# Patient Record
Sex: Female | Born: 1985 | Marital: Single | Smoking: Never smoker
Health system: Southern US, Community
[De-identification: ages and names within clinical notes are randomized; demographics above are authoritative.]

## PROBLEM LIST (undated history)

## (undated) DIAGNOSIS — Z87440 Personal history of urinary (tract) infections: Secondary | ICD-10-CM

## (undated) DIAGNOSIS — Z975 Presence of (intrauterine) contraceptive device: Secondary | ICD-10-CM

## (undated) DIAGNOSIS — Z9889 Other specified postprocedural states: Secondary | ICD-10-CM

## (undated) DIAGNOSIS — T7840XA Allergy, unspecified, initial encounter: Secondary | ICD-10-CM

## (undated) DIAGNOSIS — Z9289 Personal history of other medical treatment: Secondary | ICD-10-CM

## (undated) DIAGNOSIS — L309 Dermatitis, unspecified: Secondary | ICD-10-CM

## (undated) HISTORY — DX: Allergy, unspecified, initial encounter: T78.40XA

## (undated) HISTORY — DX: Personal history of urinary (tract) infections: Z87.440

## (undated) HISTORY — PX: THERAPEUTIC ABORTION: SHX798

## (undated) HISTORY — PX: WISDOM TOOTH EXTRACTION: SHX21

## (undated) HISTORY — DX: Presence of (intrauterine) contraceptive device: Z97.5

## (undated) HISTORY — DX: Personal history of other medical treatment: Z92.89

## (undated) HISTORY — DX: Dermatitis, unspecified: L30.9

## (undated) HISTORY — DX: Other specified postprocedural states: Z98.890

---

## 2006-08-28 DIAGNOSIS — Z9289 Personal history of other medical treatment: Secondary | ICD-10-CM

## 2006-08-28 HISTORY — DX: Personal history of other medical treatment: Z92.89

## 2010-12-30 ENCOUNTER — Ambulatory Visit: Payer: Self-pay | Admitting: Medical

## 2011-01-02 ENCOUNTER — Ambulatory Visit (INDEPENDENT_AMBULATORY_CARE_PROVIDER_SITE_OTHER): Payer: Managed Care, Other (non HMO) | Admitting: Medical

## 2011-01-02 DIAGNOSIS — M25539 Pain in unspecified wrist: Secondary | ICD-10-CM

## 2011-01-12 ENCOUNTER — Encounter: Payer: Self-pay | Admitting: Medical

## 2011-01-17 ENCOUNTER — Encounter: Payer: Self-pay | Admitting: Medical

## 2011-01-17 ENCOUNTER — Ambulatory Visit (INDEPENDENT_AMBULATORY_CARE_PROVIDER_SITE_OTHER): Payer: Managed Care, Other (non HMO) | Admitting: Medical

## 2011-01-17 VITALS — BP 110/56 | HR 80 | Temp 98.1°F | Ht 59.0 in | Wt 119.0 lb

## 2011-01-17 DIAGNOSIS — M25531 Pain in right wrist: Secondary | ICD-10-CM

## 2011-01-17 DIAGNOSIS — M65839 Other synovitis and tenosynovitis, unspecified forearm: Secondary | ICD-10-CM

## 2011-01-17 DIAGNOSIS — R209 Unspecified disturbances of skin sensation: Secondary | ICD-10-CM

## 2011-01-17 DIAGNOSIS — M25539 Pain in unspecified wrist: Secondary | ICD-10-CM

## 2011-01-17 DIAGNOSIS — R202 Paresthesia of skin: Secondary | ICD-10-CM

## 2011-01-17 MED ORDER — TRAMADOL HCL 50 MG PO TABS: 50.0000 mg | ORAL_TABLET | Freq: Four times a day (QID) | ORAL | Status: DC | PRN

## 2011-01-17 MED ORDER — NAPROXEN 375 MG PO TABS: 375.0000 mg | ORAL_TABLET | Freq: Two times a day (BID) | ORAL | Status: DC

## 2011-01-17 NOTE — Progress Notes (Signed)
Subjective:   HPI  Haley Herrera is a 25 y.o. female who presents for recheck of right wrist pain and numbness. She saw me on 01/02/2011 for initial visit for same complaint. She notes that she used to work handling fiberoptic cable, and this was repetitive work using her hands. About a year ago there was a period where she worked an excessive amount of hours, and since then she's had this pain. Pain is worse with motions of the wrist, particularly twisting motion. She notes occasional numbness in the right fourth and fifth fingers. Since last visit she has used Mobic, ice, rest, and a carpal tunnel wrist splint, and she has been doing the same types or treatment measures for months without relief. No other aggravating or relieving factors. No other complaints. No prior evaluation by orthopedics or physical therapy.  The following portions of the patient's history were reviewed and updated as appropriate: allergies, current medications, past family history, past medical history, past social history, past surgical history and problem list.  Review of Systems Constitutional: denies fever, chills, sweats, unexpected weight change, anorexia, fatigue Dermatology: denies rash, redness, bruising Gastroenterology: denies abdominal pain, nausea, vomiting, diarrhea, constipation Musculoskeletal: denies myalgias, joint swelling, back pain Neurology: no headache, weakness     Objective:   Physical Exam  General appearance: alert, no distress, WD/WN,  Musculoskeletal: Right wrist with tenderness over the ulnar side, pain with wrist motion in eversion and inversion, mild pain with wrist flexion and extension, but no obvious swelling, erythema, deformity, otherwise bilateral upper extremities without deformity, normal range of motion, non tender Extremities: no edema, no cyanosis, no clubbing Pulses: 2+ symmetric, upper and lower extremities, normal cap refill Neurological: Normal sensation and strength of  bilateral hands and fingers, negative Phalen's and negative Tinel's  X-ray of right wrist today without bony deformity, no soft tissue swelling, negative.   Assessment :    Encounter Diagnoses  Name Primary?  . Wrist pain, right Yes  . Hand tendonitis   . Paresthesia of thumb of right hand       Plan:  Advise relative rest, ice, we discontinued Mobic, and we will give Naprosyn twice a day to try stated. She can use Ultram when necessary for pain. We will refer to hand and rehabilitation specialist for therapy for the ulnar point tenderness which is likely a combination of right wrist ulnar tendinitis and overuse injury. If no improvement with these measures, we'll need to consider nerve conduction studies for the paresthesias that seem to suggest ulnar nerve syndrome.

## 2011-02-05 ENCOUNTER — Other Ambulatory Visit: Payer: Self-pay | Admitting: Medical

## 2011-03-22 ENCOUNTER — Other Ambulatory Visit: Payer: Self-pay | Admitting: Medical

## 2011-03-22 ENCOUNTER — Telehealth: Payer: Self-pay | Admitting: *Deleted

## 2011-03-22 MED ORDER — MELOXICAM 7.5 MG PO TABS: 7.5000 mg | ORAL_TABLET | Freq: Every day | ORAL | Status: DC

## 2011-03-22 NOTE — Telephone Encounter (Signed)
Call out the Mobic to her pharmacy.  I updated it in the chart.  Make sure she isn't taking this plus Naprosyn or other antiinflammatories.  F/u with physical therapy as planned.

## 2011-03-22 NOTE — Telephone Encounter (Addendum)
Message copied by Dorthula Perfect on Wed Mar 22, 2011 11:01 AM ------      Message from: Jac Canavan      Created: Tue Mar 21, 2011  2:27 PM       I received refill request on Mobic.   I haven't seen physical therapy notes or heard back from her.  Pls call and see how her hand is doing, and ask about therapy and meds.  Pls call [facility] for their notes as well.    Called and spoke to pt regarding refill on mobic.  Pt states still having pain in wrist and would like the mobic refilled.  Called [facility] and referral didn't go thru to them so faxed it again to schedule an appointment for pt.  Pt agreed to go.  CM, LPN

## 2011-03-23 NOTE — Telephone Encounter (Signed)
Called in Mobic 7.5 mg 1 po BID #60 with 1 refill per Vincenza Hews.  Pt aware.  CM, LPN

## 2011-06-29 DIAGNOSIS — Z975 Presence of (intrauterine) contraceptive device: Secondary | ICD-10-CM

## 2011-06-29 HISTORY — DX: Presence of (intrauterine) contraceptive device: Z97.5

## 2011-07-04 ENCOUNTER — Telehealth: Payer: Self-pay | Admitting: Family Medicine

## 2011-07-06 NOTE — Telephone Encounter (Signed)
Discard

## 2011-07-12 ENCOUNTER — Encounter: Payer: Self-pay | Admitting: Obstetrics & Gynecology

## 2011-07-12 ENCOUNTER — Ambulatory Visit (INDEPENDENT_AMBULATORY_CARE_PROVIDER_SITE_OTHER): Payer: 59 | Admitting: Obstetrics & Gynecology

## 2011-07-12 VITALS — BP 119/83 | HR 74 | Ht 59.0 in | Wt 116.0 lb

## 2011-07-12 DIAGNOSIS — Z01812 Encounter for preprocedural laboratory examination: Secondary | ICD-10-CM

## 2011-07-12 DIAGNOSIS — Z975 Presence of (intrauterine) contraceptive device: Secondary | ICD-10-CM

## 2011-07-12 DIAGNOSIS — Z30430 Encounter for insertion of intrauterine contraceptive device: Secondary | ICD-10-CM

## 2011-07-12 NOTE — Progress Notes (Signed)
Patient would like Mirena IUD today it was reccomended by her boyfriends sister.  She is using condoms for protection right now but she has had unprotected intercourse in the last two weeks.  Her pregnancy test today is negative.  She did have a pap smear this year at [facility].

## 2011-07-12 NOTE — Progress Notes (Signed)
  Subjective:    Patient ID: Norberto Sorenson, female    DOB: 03-Nov-1985, 25 y.o.   MRN: [id number]  HPI  She is currently using the rhythm method for birth control and would like the Mirena.  We have discussed the possible side effect of irregular VB.  She is having her period today and her urine pregnancy test is negative today.  Review of Systems Her pap is UTD at [facility]   Objective:   Physical Exam   IUD Insertion Procedure Note  Pre-operative Diagnosis: desires LARC  Post-operative Diagnosis: same  Indications: contraception  Procedure Details  Urine pregnancy test was done today and result was negative  The risks (including infection, bleeding, pain, and uterine perforation) and benefits of the procedure were explained to the patient and Verbal informed consent was obtained.    Bimanual exam revealed a NSSA uterus and non-enlarged adnexae. Cervix cleansed with Betadine. Uterus sounded to 7 cm. IUD inserted without difficulty. String visible and trimmed. Patient tolerated procedure well.  IUD Information: Mirena.  Condition: Stable  Complications: None  Plan:  The patient was advised to call for any fever or for prolonged or severe pain or bleeding. She was advised to use NSAID as needed for mild to moderate pain.   Attending Physician Documentation: I was present for or participated in the entire procedure, including opening and closing.     Assessment & Plan:  LARC- Mirena placed. She will come back for a string check in 4 weeks. She will sign a release form so we can get a copy of her latest pap smear.

## 2011-07-12 NOTE — Progress Notes (Signed)
Addended by: Allie Bossier on: 07/12/2011 10:31 AM   Modules accepted: Orders

## 2011-07-13 LAB — GC/CHLAMYDIA PROBE AMP, URINE
Chlamydia, Swab/Urine, PCR: NEGATIVE
GC Probe Amp, Urine: NEGATIVE

## 2011-08-01 ENCOUNTER — Ambulatory Visit: Payer: Self-pay | Admitting: Family Medicine

## 2011-08-03 ENCOUNTER — Ambulatory Visit: Payer: Self-pay | Admitting: Family Medicine

## 2011-08-03 DIAGNOSIS — Z0289 Encounter for other administrative examinations: Secondary | ICD-10-CM

## 2011-08-09 ENCOUNTER — Encounter: Payer: Self-pay | Admitting: Medical

## 2011-08-09 ENCOUNTER — Ambulatory Visit (INDEPENDENT_AMBULATORY_CARE_PROVIDER_SITE_OTHER): Payer: 59 | Admitting: Medical

## 2011-08-09 DIAGNOSIS — J069 Acute upper respiratory infection, unspecified: Secondary | ICD-10-CM | POA: Insufficient documentation

## 2011-08-09 DIAGNOSIS — H68009 Unspecified Eustachian salpingitis, unspecified ear: Secondary | ICD-10-CM | POA: Insufficient documentation

## 2011-08-09 MED ORDER — MOMETASONE FUROATE 50 MCG/ACT NA SUSP: 2.0000 | Freq: Every day | NASAL | Status: DC

## 2011-08-09 MED ORDER — AMOXICILLIN 875 MG PO TABS: 875.0000 mg | ORAL_TABLET | Freq: Two times a day (BID) | ORAL | Status: AC

## 2011-08-09 NOTE — Patient Instructions (Signed)
Continue to drink plenty of water, continue Mucinex DM for cough and congestion, you can do the Nyquil at night for cough.   Consider nasal saline flush such as Neti Pot.  Begin Nasonex nasal spray, 1-2 sprays per nostril twice daily.  If no improvement in 24-48 hours, or if fever or much worse ear pain in the meantime, then begin Amoxicillin antibiotic.    Use Advil if desired for pain.    Barotitis Media Barotitis media is soreness (inflammation) of the area behind the eardrum (middle ear). This occurs when the auditory tube (Eustachian tube) leading from the back of the throat to the eardrum is blocked. When it is blocked air cannot move in and out of the middle ear to equalize pressure changes. These pressure changes come from changes in altitude when:  Flying.   Driving in the mountains.   Diving.  Problems are more likely to occur with pressure changes during times when you are congested as from:  Hay fever.   Upper respiratory infection.   A cold.  Damage or hearing loss (barotrauma) caused by this may be permanent. HOME CARE INSTRUCTIONS   Use medicines as recommended by your caregiver. Over the counter medicines will help unblock the canal and can help during times of air travel.   Do not put anything into your ears to clean or unplug them. Eardrops will not be helpful.   Do not swim, dive, or fly until your caregiver says it is all right to do so. If these activities are necessary, chewing gum with frequent swallowing may help. It is also helpful to hold your nose and gently blow to pop your ears for equalizing pressure changes. This forces air into the Eustachian tube.   For little ones with problems, give your baby a bottle of water or juice during periods when pressure changes would be anticipated such as during take offs and landings associated with air travel.   Only take over-the-counter or prescription medicines for pain, discomfort, or fever as directed by your  caregiver.   A decongestant may be helpful in de-congesting the middle ear and make pressure equalization easier. This can be even more effective if the drops (spray) are delivered with the head lying over the edge of a bed with the head tilted toward the ear on the affected side.   If your caregiver has given you a follow-up appointment, it is very important to keep that appointment. Not keeping the appointment could result in a chronic or permanent injury, pain, hearing loss and disability. If there is any problem keeping the appointment, you must call back to this facility for assistance.  SEEK IMMEDIATE MEDICAL CARE IF:   You develop a severe headache, dizziness, severe ear pain, or bloody or pus-like drainage from your ears.   An oral temperature above 102 F (38.9 C) develops.   Your problems do not improve or become worse.  MAKE SURE YOU:   Understand these instructions.   Will watch your condition.   Will get help right away if you are not doing well or get worse.  Document Released: 08/11/2000 Document Revised: 04/26/2011 Document Reviewed: 03/19/2008 [facility] Patient Information 2012 [place], [place].

## 2011-08-09 NOTE — Progress Notes (Signed)
Subjective:   HPI  Haley Herrera is a 25 y.o. female who presents with 1 wk hx/o cold symptoms.  She reports cough, productive of phlegm, headache, sore throat, sinus pressure, subjective fever, chills, some nausea.  Was worse last week with cold symptoms, but in the last few days has ear pressure, left worse than right, feels off balance, had popping in left ear.  She denies hx/o asthma, or tobacco use.  No other aggravating or relieving factors.    No other c/o.  The following portions of the patient's history were reviewed and updated as appropriate: allergies, current medications, past family history, past medical history, past social history, past surgical history and problem list.  Past Medical History  Diagnosis Date  . Allergy   . Arthritis   . Ulcer    Review of Systems Constitutional: -fever, +chills, +sweats, -unexpected -weight change,+fatigue ENT: -runny nose, +ear pain, +sore throat Cardiology:  +chest pain, -palpitations, -edema Respiratory: +cough, -shortness of breath, -wheezing,+ CONGESTION Gastroenterology: -abdominal pain, -nausea, -vomiting, -diarrhea, -constipation Hematology: -bleeding or bruising problems Musculoskeletal: -arthralgias, -myalgias, -joint swelling, -back pain Ophthalmology: -vision changes Urology: -dysuria, -difficulty urinating, -hematuria, -urinary frequency, -urgency Neurology: +headache, -weakness, -tingling, -numbness   Objective:   Filed Vitals:   08/09/11 1528  BP: 90/60  Pulse: 68  Temp: 98.5 F (36.9 C)  Resp: 16    General appearance: Alert, WD/WN, no distress                             Skin: warm, no rash                           Head: +mild sinus tenderness,                            Eyes: conjunctiva normal, corneas clear, PERRLA                            Ears: serous fluid and fullness behind TMs, but no erythema, external ear canals normal                          Nose: septum midline, turbinates swollen, with  erythema and clear discharge             Mouth/throat: MMM, tongue normal, mild pharyngeal erythema                           Neck: supple, no adenopathy, no thyromegaly, nontender                          Heart: RRR, normal S1, S2, no murmurs                         Lungs: CTA bilaterally, no wheezes, rales, or rhonchi      Assessment and Plan:   Encounter Diagnoses  Name Primary?  . Eustachian salpingitis Yes  . URI (upper respiratory infection)    Prescription given for Amoxicillin if not improved in a few days with supportive care, sample of Nasonex, and nasal saline.   Can c/t Mucinex DM for congestion.  Tylenol or Ibuprofen OTC for fever and malaise.  Discussed symptomatic relief, nasal saline,  and call or return if worse or not improving.

## 2011-08-11 ENCOUNTER — Encounter: Payer: Self-pay | Admitting: Medical

## 2011-08-11 ENCOUNTER — Ambulatory Visit (INDEPENDENT_AMBULATORY_CARE_PROVIDER_SITE_OTHER): Payer: 59 | Admitting: Medical

## 2011-08-11 VITALS — BP 100/70 | HR 72 | Temp 98.3°F | Resp 16 | Ht 60.0 in | Wt 114.0 lb

## 2011-08-11 DIAGNOSIS — Z0000 Encounter for general adult medical examination without abnormal findings: Secondary | ICD-10-CM

## 2011-08-11 LAB — POCT URINALYSIS DIPSTICK
Nitrite, UA: NEGATIVE
Spec Grav, UA: <=1.005
Urobilinogen, UA: NEGATIVE
pH, UA: 8

## 2011-08-11 LAB — COMPREHENSIVE METABOLIC PANEL
AST: 22 U/L (ref 0–37)
Albumin: 4.8 g/dL (ref 3.5–5.2)
Alkaline Phosphatase: 49 U/L (ref 39–117)
BUN: 12 mg/dL (ref 6–23)
Calcium: 9.8 mg/dL (ref 8.4–10.5)
Chloride: 102 mEq/L (ref 96–112)
Creat: 0.85 mg/dL (ref 0.50–1.10)
Glucose, Bld: 79 mg/dL (ref 70–99)

## 2011-08-11 LAB — CBC WITH DIFFERENTIAL/PLATELET
Basophils Absolute: 0 10*3/uL (ref 0.0–0.1)
Basophils Relative: 0 % (ref 0–1)
Hemoglobin: 13.8 g/dL (ref 12.0–15.0)
Lymphocytes Relative: 16 % (ref 12–46)
MCHC: 33 g/dL (ref 30.0–36.0)
Neutro Abs: 9.7 10*3/uL — ABNORMAL HIGH (ref 1.7–7.7)
Neutrophils Relative %: 77 % (ref 43–77)
RDW: 13.3 % (ref 11.5–15.5)
WBC: 12.6 10*3/uL — ABNORMAL HIGH (ref 4.0–10.5)

## 2011-08-11 LAB — LIPID PANEL
Cholesterol: 156 mg/dL (ref 0–200)
Triglycerides: 80 mg/dL (ref <150)

## 2011-08-11 MED ORDER — BENZONATATE 200 MG PO CAPS: 200.0000 mg | ORAL_CAPSULE | Freq: Three times a day (TID) | ORAL | Status: AC | PRN

## 2011-08-11 NOTE — Patient Instructions (Signed)

## 2011-08-12 ENCOUNTER — Encounter: Payer: Self-pay | Admitting: Medical

## 2011-08-12 DIAGNOSIS — Z0000 Encounter for general adult medical examination without abnormal findings: Secondary | ICD-10-CM | POA: Insufficient documentation

## 2011-08-12 NOTE — Progress Notes (Signed)
Subjective:   HPI  Haley Herrera is a 25 y.o. female who presents for a complete physical.  I saw her recently for sinus infection.  She recently had IUD inserted in 11/12, and is having frequent bleeding.  She plans to discuss with her gynecologist Dr. Marice Potter next week.  Her gynecological hx/o includes 3 pregnancies with 1 TAB and 2 SAB.  She declines flu shot.  Last Tetanus within 10 years per her report.  [place] originally [place] and moved to the [place] in 2006, had vaccines updated then.  I saw her a few months ago for wrist and hand pain and numbness, and after I sent her to physical therapy, she says the PT resolved the symptoms.  Otherwise in normal state of health.  Eating healthy, exercising regularly, works as a live in Social worker.  Reviewed their medical, surgical, family, social, medication, and allergy history and updated chart as appropriate.  Past Medical History  Diagnosis Date  . Allergy   . Arthritis   . Ulcer   . History of UTI   . Gastric ulcer 2008  . IUD (intrauterine device) in place 06/2011    Mirena, Dr. Marice Potter  . History of gynecological procedure     TAB x 1, SAB x 2    Past Surgical History  Procedure Date  . Wisdom tooth extraction     Family History  Problem Relation Age of Onset  . Hypertension Mother   . Kidney disease Father   . Diabetes Maternal Grandmother   . Heart disease Maternal Grandfather   . Cancer Neg Hx   . Stroke Neg Hx     History   Social History  . Marital Status: Single    Spouse Name: N/A    Number of Children: N/A  . Years of Education: N/A   Occupational History  . nanny    Social History Main Topics  . Smoking status: Never Smoker   . Smokeless tobacco: Never Used  . Alcohol Use: 1.5 oz/week    3 drink(s) per week  . Drug Use: No  . Sexually Active: Yes -- Female partner(s)    Birth Control/ Protection: Condom   Other Topics Concern  . Not on file   Social History Narrative   Single, exercise - run, walk,  weights, Christian    Current Outpatient Prescriptions on File Prior to Visit  Medication Sig Dispense Refill  . amoxicillin (AMOXIL) 875 MG tablet Take 1 tablet (875 mg total) by mouth 2 (two) times daily.  20 tablet  0  . mometasone (NASONEX) 50 MCG/ACT nasal spray Place 2 sprays into the nose daily.  17 g  0    No Known Allergies   Review of Systems Constitutional: denies fever, chills, sweats, unexpected weight change, anorexia, fatigue Allergy: negative; denies recent sneezing, itching, congestion Dermatology: denies changing moles, rash, lumps, new worrisome lesions ENT: +runny nose, sinus pressure, but no ear pain, sore throat, hoarseness, teeth pain, tinnitus, hearing loss, epistaxis Cardiology: denies chest pain, palpitations, edema, orthopnea, paroxysmal nocturnal dyspnea Respiratory: denies cough, shortness of breath, dyspnea on exertion, wheezing, hemoptysis Gastroenterology: denies abdominal pain, nausea, vomiting, diarrhea, constipation, blood in stool, changes in bowel movement, dysphagia Hematology: denies bleeding or bruising problems Musculoskeletal: denies arthralgias, myalgias, joint swelling, back pain, neck pain, cramping, gait changes Ophthalmology: denies vision changes, eye redness, itching, discharge Urology: denies dysuria, difficulty urinating, hematuria, urinary frequency, urgency, incontinence Neurology: no headache, weakness, tingling, numbness, speech abnormality, memory loss, falls, dizziness  Psychology: denies depressed mood, agitation, sleep problems     Objective:   Physical Exam  Filed Vitals:   08/11/11 1006  BP: 100/70  Pulse: 72  Temp: 98.3 F (36.8 C)  Resp: 16    General appearance: alert, no distress, WD/WN, [place] female Skin: unremarkable HEENT: normocephalic, conjunctiva/corneas normal, sclerae anicteric, PERRLA, EOMi, nares patent, no discharge or erythema, pharynx normal Oral cavity: MMM, tongue normal, teeth  normal Neck: supple, no lymphadenopathy, no thyromegaly, no masses, normal ROM Chest: non tender, normal shape and expansion Heart: RRR, normal S1, S2, no murmurs Lungs: CTA bilaterally, no wheezes, rhonchi, or rales Abdomen: +bs, soft, non tender, non distended, no masses, no hepatomegaly, no splenomegaly, no bruits Back: non tender, normal ROM, no scoliosis Musculoskeletal: upper extremities non tender, no obvious deformity, normal ROM throughout, lower extremities non tender, no obvious deformity, normal ROM throughout Extremities: no edema, no cyanosis, no clubbing Pulses: 2+ symmetric, upper and lower extremities, normal cap refill Neurological: alert, oriented x 3, CN2-12 intact, strength normal upper extremities and lower extremities, sensation normal throughout, DTRs 2+ throughout, no cerebellar signs, gait normal Psychiatric: normal affect, behavior normal, pleasant  Breast/gyn - deferred to gynecology   Assessment and Plan :    Encounter Diagnosis  Name Primary?  . Routine general medical examination at a health care facility Yes   Physical exam - discussed healthy lifestyle, diet, exercise, preventative care, vaccinations, and addressed their concerns.  Handout given.  Follow-up pending labs.

## 2011-09-07 ENCOUNTER — Encounter: Payer: Self-pay | Admitting: Obstetrics & Gynecology

## 2011-09-07 ENCOUNTER — Ambulatory Visit (INDEPENDENT_AMBULATORY_CARE_PROVIDER_SITE_OTHER): Payer: 59 | Admitting: Obstetrics & Gynecology

## 2011-09-07 VITALS — BP 103/74 | HR 68 | Ht 59.0 in | Wt 116.0 lb

## 2011-09-07 DIAGNOSIS — Z1272 Encounter for screening for malignant neoplasm of vagina: Secondary | ICD-10-CM

## 2011-09-07 DIAGNOSIS — Z113 Encounter for screening for infections with a predominantly sexual mode of transmission: Secondary | ICD-10-CM

## 2011-09-07 DIAGNOSIS — Z0000 Encounter for general adult medical examination without abnormal findings: Secondary | ICD-10-CM

## 2011-09-07 NOTE — Progress Notes (Signed)
Patient is here for routine exam.  Wants GC/Chly with pap but declines blood tests.

## 2011-09-07 NOTE — Progress Notes (Signed)
Addended by: Barbara Cower on: 09/07/2011 12:14 PM   Modules accepted: Orders

## 2011-09-07 NOTE — Progress Notes (Signed)
Subjective:    Haley Herrera is a 26 y.o. female who presents for an annual exam. The patient has no complaints today. She is happy with the Mirenaq. The patient is sexually active. GYN screening history: last pap: was normal. The patient wears seatbelts: yes. The patient participates in regular exercise: yes. Has the patient ever been transfused or tattooed?: no. The patient reports that there is not domestic violence in her life.   Menstrual History: OB History    Grav Para Term Preterm Abortions TAB SAB Ect Mult Living   2 0 0 0 2 1 1 0 0 0       Menarche age: 27 No LMP recorded. Patient is not currently having periods (Reason: IUD).    The following portions of the patient's history were reviewed and updated as appropriate: allergies, current medications, past family history, past medical history, past social history, past surgical history and problem list.  Review of Systems A comprehensive review of systems was negative. She is currently a Social worker.   Objective:    BP 103/74  Pulse 68  Ht 4\' 11"  (1.499 m)  Wt 116 lb (52.617 kg)  BMI 23.43 kg/m2  General Appearance:    Alert, cooperative, no distress, appears stated age  Head:    Normocephalic, without obvious abnormality, atraumatic  Eyes:    PERRL, conjunctiva/corneas clear, EOM's intact, fundi    benign, both eyes  Ears:    Normal TM's and external ear canals, both ears  Nose:   Nares normal, septum midline, mucosa normal, no drainage    or sinus tenderness  Throat:   Lips, mucosa, and tongue normal; teeth and gums normal  Neck:   Supple, symmetrical, trachea midline, no adenopathy;    thyroid:  no enlargement/tenderness/nodules; no carotid   bruit or JVD  Back:     Symmetric, no curvature, ROM normal, no CVA tenderness  Lungs:     Clear to auscultation bilaterally, respirations unlabored  Chest Wall:    No tenderness or deformity   Heart:    Regular rate and rhythm, S1 and S2 normal, no murmur, rub   or gallop  Breast  Exam:    No tenderness, masses, or nipple abnormality  Abdomen:     Soft, non-tender, bowel sounds active all four quadrants,    no masses, no organomegaly  Genitalia:    Normal female without lesion, discharge or tenderness, NSSA, NT, no adnexal masses.     Extremities:   Extremities normal, atraumatic, no cyanosis or edema  Pulses:   2+ and symmetric all extremities  Skin:   Skin color, texture, turgor normal, no rashes or lesions  Lymph nodes:   Cervical, supraclavicular, and axillary nodes normal  Neurologic:   CNII-XII intact, normal strength, sensation and reflexes    throughout  .    Assessment:    Healthy female exam.    Plan:     Pap smear. She declines Gardasil and the flu vaccine.

## 2011-10-24 ENCOUNTER — Ambulatory Visit: Payer: 59 | Admitting: Obstetrics & Gynecology

## 2012-02-12 ENCOUNTER — Ambulatory Visit: Payer: 59 | Admitting: Obstetrics & Gynecology

## 2012-02-21 ENCOUNTER — Encounter: Payer: Self-pay | Admitting: Obstetrics and Gynecology

## 2012-02-21 ENCOUNTER — Ambulatory Visit (INDEPENDENT_AMBULATORY_CARE_PROVIDER_SITE_OTHER): Payer: 59 | Admitting: Obstetrics and Gynecology

## 2012-02-21 VITALS — BP 133/84 | HR 90 | Ht 59.0 in | Wt 111.0 lb

## 2012-02-21 DIAGNOSIS — N939 Abnormal uterine and vaginal bleeding, unspecified: Secondary | ICD-10-CM

## 2012-02-21 NOTE — Progress Notes (Signed)
  26 yo G0 with LMP 6/15 presenting today for evaluation of abnormal uterine bleeding. Patient reports experiencing 2-3 weeks/ month of vaginal spotting since IUD insertion. Patient initially thought it would self resolve but it hasn't almost a year after insertion. Patient states that prior to IUD she had a normal monthly period lasting 5 days. Patient denies any cramping pain  SSE: Thin white frothy discharge. Normal vagina and cervix. IUD strings visualized at os Bimanual exam. Small uterus, no palpable adnexal masses or tenderness  A/P 26 yo with abnormal uterine bleeding s/p IUD placement almost a year ago - Wet prep collected - Will order ultrasound for IUD placement - Discussed trial of OCP with IUD. - Patient will be contacted with results.

## 2012-02-22 LAB — WET PREP, GENITAL: Trich, Wet Prep: NONE SEEN

## 2012-02-22 MED ORDER — METRONIDAZOLE 500 MG PO TABS: 500.0000 mg | ORAL_TABLET | Freq: Two times a day (BID) | ORAL | Status: AC

## 2012-02-22 NOTE — Progress Notes (Signed)
Patient with BV- prescription e-prescribed.

## 2012-02-22 NOTE — Addendum Note (Signed)
Addended by: Catalina Antigua on: 02/22/2012 08:38 AM   Modules accepted: Orders

## 2012-09-10 ENCOUNTER — Ambulatory Visit (INDEPENDENT_AMBULATORY_CARE_PROVIDER_SITE_OTHER): Payer: Commercial Indemnity | Admitting: Obstetrics & Gynecology

## 2012-09-10 ENCOUNTER — Encounter: Payer: Self-pay | Admitting: Obstetrics & Gynecology

## 2012-09-10 ENCOUNTER — Inpatient Hospital Stay (HOSPITAL_COMMUNITY): Admission: RE | Admit: 2012-09-10 | Payer: Self-pay | Source: Ambulatory Visit

## 2012-09-10 VITALS — BP 114/79 | HR 63 | Ht 59.0 in | Wt 124.0 lb

## 2012-09-10 DIAGNOSIS — N898 Other specified noninflammatory disorders of vagina: Secondary | ICD-10-CM

## 2012-09-10 DIAGNOSIS — Z1151 Encounter for screening for human papillomavirus (HPV): Secondary | ICD-10-CM

## 2012-09-10 DIAGNOSIS — Z01419 Encounter for gynecological examination (general) (routine) without abnormal findings: Secondary | ICD-10-CM

## 2012-09-10 DIAGNOSIS — Z124 Encounter for screening for malignant neoplasm of cervix: Secondary | ICD-10-CM

## 2012-09-10 DIAGNOSIS — Z113 Encounter for screening for infections with a predominantly sexual mode of transmission: Secondary | ICD-10-CM

## 2012-09-10 DIAGNOSIS — Z0000 Encounter for general adult medical examination without abnormal findings: Secondary | ICD-10-CM

## 2012-09-10 NOTE — Progress Notes (Signed)
Subjective:    Haley Herrera is a 27 y.o. female who presents for an annual exam. The patient has no complaints today. The patient is sexually active. GYN screening history: last pap: was normal. The patient wears seatbelts: yes. The patient participates in regular exercise: yes (aerobic, strength at the [facility] at [place]). Has the patient ever been transfused or tattooed?: no. The patient reports that there is not domestic violence in her life.   Menstrual History: OB History    Grav Para Term Preterm Abortions TAB SAB Ect Mult Living   2 0 0 0 2 1 1 0 0 0       Menarche age: 80 Patient's last menstrual period was 08/23/2012.    The following portions of the patient's history were reviewed and updated as appropriate: allergies, current medications, past family history, past medical history, past social history, past surgical history and problem list.  Review of Systems A comprehensive review of systems was negative. Lives with her boyfriend. Denies dyspareunia. Works as a Social worker.   Objective:    BP 114/79  Pulse 63  Ht 4\' 11"  (1.499 m)  Wt 124 lb (56.246 kg)  BMI 25.04 kg/m2  LMP 08/23/2012  General Appearance:    Alert, cooperative, no distress, appears stated age  Head:    Normocephalic, without obvious abnormality, atraumatic  Eyes:    PERRL, conjunctiva/corneas clear, EOM's intact, fundi    benign, both eyes  Ears:    Normal TM's and external ear canals, both ears  Nose:   Nares normal, septum midline, mucosa normal, no drainage    or sinus tenderness  Throat:   Lips, mucosa, and tongue normal; teeth and gums normal  Neck:   Supple, symmetrical, trachea midline, no adenopathy;    thyroid:  no enlargement/tenderness/nodules; no carotid   bruit or JVD  Back:     Symmetric, no curvature, ROM normal, no CVA tenderness  Lungs:     Clear to auscultation bilaterally, respirations unlabored  Chest Wall:    No tenderness or deformity   Heart:    Regular rate and rhythm, S1 and  S2 normal, no murmur, rub   or gallop  Breast Exam:    No tenderness, masses, or nipple abnormality  Abdomen:     Soft, non-tender, bowel sounds active all four quadrants,    no masses, no organomegaly  Genitalia:    Normal female without lesion or tenderness, NSSA, NT, normal adnexal exam. Frothy discharge, Mirena strings visible     Extremities:   Extremities normal, atraumatic, no cyanosis or edema  Pulses:   2+ and symmetric all extremities  Skin:   Skin color, texture, turgor normal, no rashes or lesions  Lymph nodes:   Cervical, supraclavicular, and axillary nodes normal  Neurologic:   CNII-XII intact, normal strength, sensation and reflexes    throughout  .    Assessment:    Healthy female exam.    Plan:     Thin prep Pap smear.  HPV testing Rec SBE monthly Declines flu vaccine/Gardasil

## 2012-09-11 LAB — WET PREP, GENITAL: Trich, Wet Prep: NONE SEEN

## 2012-11-07 ENCOUNTER — Encounter: Payer: Self-pay | Admitting: Internal Medicine

## 2012-11-18 ENCOUNTER — Encounter: Payer: Commercial Indemnity | Admitting: Medical

## 2013-04-30 ENCOUNTER — Encounter: Payer: Self-pay | Admitting: Medical

## 2013-08-26 ENCOUNTER — Encounter: Payer: Self-pay | Admitting: Medical

## 2013-09-09 ENCOUNTER — Encounter: Payer: Self-pay | Admitting: Medical

## 2013-09-09 ENCOUNTER — Ambulatory Visit (INDEPENDENT_AMBULATORY_CARE_PROVIDER_SITE_OTHER): Payer: BC Managed Care – PPO | Admitting: Medical

## 2013-09-09 VITALS — BP 92/60 | HR 88 | Temp 98.6°F | Resp 16 | Ht 59.0 in | Wt 116.0 lb

## 2013-09-09 DIAGNOSIS — L309 Dermatitis, unspecified: Secondary | ICD-10-CM

## 2013-09-09 DIAGNOSIS — K5900 Constipation, unspecified: Secondary | ICD-10-CM

## 2013-09-09 DIAGNOSIS — L259 Unspecified contact dermatitis, unspecified cause: Secondary | ICD-10-CM

## 2013-09-09 DIAGNOSIS — Z0000 Encounter for general adult medical examination without abnormal findings: Secondary | ICD-10-CM

## 2013-09-09 LAB — CBC
HEMATOCRIT: 40.1 % (ref 36.0–46.0)
HEMOGLOBIN: 13.4 g/dL (ref 12.0–15.0)
MCH: 28.8 pg (ref 26.0–34.0)
MCHC: 33.4 g/dL (ref 30.0–36.0)
MCV: 86.1 fL (ref 78.0–100.0)
Platelets: 372 10*3/uL (ref 150–400)
RBC: 4.66 MIL/uL (ref 3.87–5.11)
RDW: 13 % (ref 11.5–15.5)
WBC: 8.5 10*3/uL (ref 4.0–10.5)

## 2013-09-09 LAB — POCT URINALYSIS DIPSTICK
BILIRUBIN UA: NEGATIVE
Blood, UA: NEGATIVE
Glucose, UA: NEGATIVE
KETONES UA: NEGATIVE
LEUKOCYTES UA: NEGATIVE
Nitrite, UA: NEGATIVE
PH UA: 8
Protein, UA: NEGATIVE
Spec Grav, UA: <=1.005
Urobilinogen, UA: NEGATIVE

## 2013-09-09 NOTE — Patient Instructions (Signed)
Eczema Eczema, also called atopic dermatitis, is a skin disorder that causes inflammation of the skin. It causes a red rash and dry, scaly skin. The skin becomes very itchy. Eczema is generally worse during the cooler winter months and often improves with the warmth of summer. Eczema usually starts showing signs in infancy. Some children outgrow eczema, but it may last through adulthood.  CAUSES  The exact cause of eczema is not known, but it appears to run in families. People with eczema often have a family history of eczema, allergies, asthma, or hay fever. Eczema is not contagious. Flare-ups of the condition may be caused by:   Contact with something you are sensitive or allergic to.   Stress. SIGNS AND SYMPTOMS  Dry, scaly skin.   Red, itchy rash.   Itchiness. This may occur before the skin rash and may be very intense.  DIAGNOSIS  The diagnosis of eczema is usually made based on symptoms and medical history. TREATMENT  Eczema cannot be cured, but symptoms usually can be controlled with treatment and other strategies. A treatment plan might include:  Controlling the itching and scratching.   Use over-the-counter antihistamines as directed for itching. This is especially useful at night when the itching tends to be worse.   Use over-the-counter steroid creams as directed for itching.   Avoid scratching. Scratching makes the rash and itching worse. It may also result in a skin infection (impetigo) due to a break in the skin caused by scratching.   Keeping the skin well moisturized with creams every day. This will seal in moisture and help prevent dryness. Lotions that contain alcohol and water should be avoided because they can dry the skin.  Lubriderm and Aqufaor are good options OTC.  Limiting exposure to things that you are sensitive or allergic to (allergens).   Recognizing situations that cause stress.   Developing a plan to manage stress.  HOME CARE  INSTRUCTIONS   Only take over-the-counter or prescription medicines as directed by your health care provider.   Do not use anything on the skin without checking with your health care provider.   Keep baths or showers short (5 minutes) in warm (not hot) water. Use mild cleansers for bathing. These should be unscented. You may add non perfumed bath oil to the bath water. It is best to avoid soap and bubble bath.   Immediately after a bath or shower, when the skin is still damp, apply a moisturizing ointment to the entire body. This ointment should be a petroleum ointment. This will seal in moisture and help prevent dryness. The thicker the ointment, the better. These should be unscented.   Keep fingernails cut short. Children with eczema may need to wear soft gloves or mittens at night after applying an ointment.   Dress in clothes made of cotton or cotton blends. Dress lightly, because heat increases itching.   A child with eczema should stay away from anyone with fever blisters or cold sores. The virus that causes fever blisters (herpes simplex) can cause a serious skin infection in children with eczema. SEEK MEDICAL CARE IF:   Your itching interferes with sleep.   Your rash gets worse or is not better within 1 week after starting treatment.   You see pus or soft yellow scabs in the rash area.   You have a fever.   You have a rash flare-up after contact with someone who has fever blisters.  Document Released: 08/11/2000 Document Revised: 06/04/2013 Document Reviewed: 03/17/2013  [employer] Patient Information 2014 [place], [place].    Constipation, Adult Constipation is when a person has fewer than 3 bowel movements a week; has difficulty having a bowel movement; or has stools that are dry, hard, or larger than normal. As people grow older, constipation is more common. If you try to fix constipation with medicines that make you have a bowel movement (laxatives), the problem may  get worse. Long-term laxative use may cause the muscles of the colon to become weak. A low-fiber diet, not taking in enough fluids, and taking certain medicines may make constipation worse. CAUSES   Certain medicines, such as antidepressants, pain medicine, iron supplements, antacids, and water pills.   Certain diseases, such as diabetes, irritable bowel syndrome (IBS), thyroid disease, or depression.   Not drinking enough water.   Not eating enough fiber-rich foods.   Stress or travel.  Lack of physical activity or exercise.  Not going to the restroom when there is the urge to have a bowel movement.  Ignoring the urge to have a bowel movement.  Using laxatives too much. SYMPTOMS   Having fewer than 3 bowel movements a week.   Straining to have a bowel movement.   Having hard, dry, or larger than normal stools.   Feeling full or bloated.   Pain in the lower abdomen.  Not feeling relief after having a bowel movement. DIAGNOSIS  Your caregiver will take a medical history and perform a physical exam. Further testing may be done for severe constipation. Some tests may include:   A barium enema X-ray to examine your rectum, colon, and sometimes, your small intestine.  A sigmoidoscopy to examine your lower colon.  A colonoscopy to examine your entire colon. TREATMENT  Treatment will depend on the severity of your constipation and what is causing it. Some dietary treatments include drinking more fluids and eating more fiber-rich foods. Lifestyle treatments may include regular exercise. If these diet and lifestyle recommendations do not help, your caregiver may recommend taking over-the-counter laxative medicines to help you have bowel movements. Prescription medicines may be prescribed if over-the-counter medicines do not work.  HOME CARE INSTRUCTIONS   Increase dietary fiber in your diet, such as fruits, vegetables, whole grains, and beans. Limit high-fat and processed  sugars in your diet, such as [place] fries, hamburgers, cookies, candies, and soda.   A fiber supplement may be added to your diet if you cannot get enough fiber from foods.   Drink enough fluids to keep your urine clear or pale yellow.   Exercise regularly or as directed by your caregiver.   Go to the restroom when you have the urge to go. Do not hold it.  Only take medicines as directed by your caregiver. Do not take other medicines for constipation without talking to your caregiver first. SEEK IMMEDIATE MEDICAL CARE IF:   You have bright red blood in your stool.   Your constipation lasts for more than 4 days or gets worse.   You have abdominal or rectal pain.   You have thin, pencil-like stools.  You have unexplained weight loss. MAKE SURE YOU:   Understand these instructions.  Will watch your condition.  Will get help right away if you are not doing well or get worse. Document Released: 05/12/2004 Document Revised: 11/06/2011 Document Reviewed: 05/26/2013 [facility] Patient Information 2014 [place], [place].

## 2013-09-09 NOTE — Progress Notes (Signed)
Subjective:   HPI  Haley Herrera is a 28 y.o. female who presents for a complete physical.  Got married 06/2013.  Needs physical form completed for work/insurance.  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     Preventative care: Last ophthalmology visit:n/a Last dental visit:yes- [place] [facility] Last colonoscopy:n/a Last mammogram:n/a Last gynecological exam:last pap 08/2012 Last EKG:n/a Last labs:2012  Prior vaccinations: TD or Tdap:8 years ago Influenza:no Pneumococcal:n/a Shingles/Zostavax:n/a  Advanced directive:n/a Health care power of attorney:n/a Living will:n/a  Concerns: Lately been having some dry itchy skin of some of her fingers. Using some over-the-counter hydrocortisone with improvement. At times uses various lotions over-the-counter  She notes having about 2 bowel movements a week, sometimes increased gas and pain, at times pain with defecation, has history of some mild constipation  Sees gynecology, up-to-date on Pap  Reviewed their medical, surgical, family, social, medication, and allergy history and updated chart as appropriate.  Past Medical History  Diagnosis Date  . Allergy   . History of UTI   . IUD (intrauterine device) in place 06/2011    Mirena, Dr. Marice Potterove  . History of gynecological procedure     TAB x 1, SAB x 2  . History of upper GI x-ray series 2008  . Gastric ulcer 2008    Past Surgical History  Procedure Laterality Date  . Wisdom tooth extraction      History   Social History  . Marital Status: Single    Spouse Name: N/A    Number of Children: N/A  . Years of Education: N/A   Occupational History  . nanny    Social History Main Topics  . Smoking status: Never Smoker   . Smokeless tobacco: Never Used  . Alcohol Use: 1.5 oz/week    3 drink(s) per week     Comment: socially  . Drug Use: No  . Sexual Activity: Yes    Partners: Male    Birth Control/ Protection: Condom, IUD   Other Topics  Concern  . Not on file   Social History Narrative   Married 06/2013, exercise - run, walk, weights, [employer],   [employer], taking pre-requisites to get into nursing program    Family History  Problem Relation Age of Onset  . Hypertension Father   . Diabetes Maternal Grandmother   . Heart disease Maternal Grandfather   . Cancer Neg Hx   . Stroke Neg Hx     Current outpatient prescriptions:levonorgestrel (MIRENA) 20 MCG/24HR IUD, 1 each by Intrauterine route once., Disp: , Rfl:   No Known Allergies     Review of Systems Constitutional: -fever, -chills, -sweats, -unexpected weight change, -decreased appetite, -fatigue Allergy: -sneezing, -itching, -congestion Dermatology: -changing moles, --rash, -lumps, +eczema ENT: -runny nose, -ear pain, -sore throat, -hoarseness, -sinus pain, -teeth pain, - ringing in ears, -hearing loss, -nosebleeds Cardiology: -chest pain, -palpitations, -swelling, -difficulty breathing when lying flat, -waking up short of breath Respiratory: -cough, -shortness of breath, -difficulty breathing with exercise or exertion, -wheezing, -coughing up blood Gastroenterology: -abdominal pain, -nausea, -vomiting, -diarrhea, -constipation, -blood in stool, -changes in bowel movement, -difficulty swallowing or eating Hematology: -bleeding, -bruising  Musculoskeletal: -joint aches, -muscle aches, -joint swelling, -back pain, -neck pain, -cramping, -changes in gait Ophthalmology: denies vision changes, eye redness, itching, discharge Urology: -burning with urination, -difficulty urinating, -blood in urine, -urinary frequency, -urgency, -incontinence Neurology: -headache, -weakness, -tingling, -numbness, -memory loss, -falls, -dizziness Psychology: -depressed mood, -agitation, -sleep problems     Objective:   Physical  Exam  Blood pressure 92/60, pulse 88, temperature 98.6 F (37 C), temperature source Oral, resp. rate 16, height 4\' 11"  (1.499 m), weight 116 lb (52.617  kg).  General appearance: alert, no distress, WD/WN, [place] female  Skin: Mild rough dry skin of her distal right fourth finger, otherwise skin unremarkable, no worrisome lesions HEENT: normocephalic, conjunctiva/corneas normal, sclerae anicteric, PERRLA, EOMi, nares patent, no discharge or erythema, pharynx normal Oral cavity: MMM, tongue normal, teeth in good repair Neck: supple, no lymphadenopathy, no thyromegaly, no masses, normal ROM Chest: non tender, normal shape and expansion Heart: RRR, normal S1, S2, no murmurs Lungs: CTA bilaterally, no wheezes, rhonchi, or rales Abdomen: +bs, soft, non tender, non distended, no masses, no hepatomegaly, no splenomegaly, no bruits Back: non tender, normal ROM, no scoliosis Musculoskeletal: upper extremities non tender, no obvious deformity, normal ROM throughout, lower extremities non tender, no obvious deformity, normal ROM throughout Extremities: no edema, no cyanosis, no clubbing Pulses: 2+ symmetric, upper and lower extremities, normal cap refill Neurological: alert, oriented x 3, CN2-12 intact, strength normal upper extremities and lower extremities, sensation normal throughout, DTRs 2+ throughout, no cerebellar signs, gait normal Psychiatric: normal affect, behavior normal, pleasant  Breast/gyn/rectal - deferred to gynecology   Assessment and Plan :    Encounter Diagnoses  Name Primary?  . Routine general medical examination at a health care facility Yes  . Eczema   . Constipation     Physical exam - discussed healthy lifestyle, diet, exercise, preventative care, vaccinations, and addressed their concerns.  Handout given. Eczema - discussed preventative measures, daily moisturizing lotion, can continue hydrocortisone cream over-the-counter for flareups Constipation-discussed hydration, fiber intake, preventative measures. Handout given. Call or return if not seeing improvement Follow-up pending labs.

## 2013-09-10 LAB — HIV ANTIBODY (ROUTINE TESTING W REFLEX): HIV: NONREACTIVE

## 2013-09-24 ENCOUNTER — Encounter: Payer: Self-pay | Admitting: Obstetrics and Gynecology

## 2013-09-24 ENCOUNTER — Ambulatory Visit (INDEPENDENT_AMBULATORY_CARE_PROVIDER_SITE_OTHER): Payer: BC Managed Care – PPO | Admitting: Obstetrics and Gynecology

## 2013-09-24 VITALS — BP 126/73 | HR 74 | Ht 59.0 in | Wt 117.0 lb

## 2013-09-24 DIAGNOSIS — Z124 Encounter for screening for malignant neoplasm of cervix: Secondary | ICD-10-CM

## 2013-09-24 DIAGNOSIS — Z01419 Encounter for gynecological examination (general) (routine) without abnormal findings: Secondary | ICD-10-CM

## 2013-09-24 NOTE — Progress Notes (Signed)
  Subjective:     Haley Herrera is a 28 y.o. female G2P0020 with BMI 23 and Mirena induced amenorrhea who is here for a comprehensive physical exam. The patient reports no problems. Patient is sexually active without any issues.  History   Social History  . Marital Status: Single    Spouse Name: N/A    Number of Children: N/A  . Years of Education: N/A   Occupational History  . nanny    Social History Main Topics  . Smoking status: Never Smoker   . Smokeless tobacco: Never Used  . Alcohol Use: 1.5 oz/week    3 drink(s) per week     Comment: socially  . Drug Use: No  . Sexual Activity: Yes    Partners: Male    Birth Control/ Protection: Condom, IUD   Other Topics Concern  . 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 Influenza Vaccine  03/28/2013  . Pap Smear  09/11/2015  . Tetanus/tdap  10/11/2015   Past Medical History  Diagnosis Date  . Allergy   . History of UTI   . IUD (intrauterine device) in place 06/2011    Mirena, Dr. Marice Potterove  . History of gynecological procedure     TAB x 1, SAB x 2  . History of upper GI x-ray series 2008  . Gastric ulcer 2008   Past Surgical History  Procedure Laterality Date  . Wisdom tooth extraction     Family History  Problem Relation Age of Onset  . Hypertension Father   . Diabetes Maternal Grandmother   . Heart disease Maternal Grandfather   . Cancer Neg Hx   . Stroke Neg Hx    History  Substance Use Topics  . Smoking status: Never Smoker   . Smokeless tobacco: Never Used  . Alcohol Use: 1.5 oz/week    3 drink(s) per week     Comment: socially       Review of Systems A comprehensive review of systems was negative.   Objective:      GENERAL: Well-developed, well-nourished female in no acute distress.  HEENT: Normocephalic, atraumatic. Sclerae anicteric.   NECK: Supple. Normal thyroid.  LUNGS: Clear to auscultation bilaterally.  HEART: Regular rate and rhythm. BREASTS: Symmetric in size. No palpable masses or lymphadenopathy, skin changes, or nipple drainage. ABDOMEN: Soft, nontender, nondistended. No organomegaly. PELVIC: Normal external female genitalia. Vagina is pink and rugated.  Normal discharge. Normal appearing cervix with IUD strings visualized at the external os. Uterus is normal in size. No adnexal mass or tenderness. EXTREMITIES: No cyanosis, clubbing, or edema, 2+ distal pulses.    Assessment:    Healthy female exam.      Plan:    Pap smear collected Patient advised to start monthly self breast exams Patient will be contacted with any abnormal results See After Visit Summary for Counseling Recommendations

## 2013-09-24 NOTE — Patient Instructions (Signed)
Preventive Care for Adults, Female A healthy lifestyle and preventive care can promote health and wellness. Preventive health guidelines for women include the following key practices.  A routine yearly physical is a good way to check with your health care provider about your health and preventive screening. It is a chance to share any concerns and updates on your health and to receive a thorough exam.  Visit your dentist for a routine exam and preventive care every 6 months. Brush your teeth twice a day and floss once a day. Good oral hygiene prevents tooth decay and gum disease.  The frequency of eye exams is based on your age, health, family medical history, use of contact lenses, and other factors. Follow your health care provider's recommendations for frequency of eye exams.  Eat a healthy diet. Foods like vegetables, fruits, whole grains, low-fat dairy products, and lean protein foods contain the nutrients you need without too many calories. Decrease your intake of foods high in solid fats, added sugars, and salt. Eat the right amount of calories for you.Get information about a proper diet from your health care provider, if necessary.  Regular physical exercise is one of the most important things you can do for your health. Most adults should get at least 150 minutes of moderate-intensity exercise (any activity that increases your heart rate and causes you to sweat) each week. In addition, most adults need muscle-strengthening exercises on 2 or more days a week.  Maintain a healthy weight. The body mass index (BMI) is a screening tool to identify possible weight problems. It provides an estimate of body fat based on height and weight. Your health care provider can find your BMI, and can help you achieve or maintain a healthy weight.For adults 20 years and older:  A BMI below 18.5 is considered underweight.  A BMI of 18.5 to 24.9 is normal.  A BMI of 25 to 29.9 is considered  overweight.  A BMI of 30 and above is considered obese.  Maintain normal blood lipids and cholesterol levels by exercising and minimizing your intake of saturated fat. Eat a balanced diet with plenty of fruit and vegetables. Blood tests for lipids and cholesterol should begin at age 20 and be repeated every 5 years. If your lipid or cholesterol levels are high, you are over 50, or you are at high risk for heart disease, you may need your cholesterol levels checked more frequently.Ongoing high lipid and cholesterol levels should be treated with medicines if diet and exercise are not working.  If you smoke, find out from your health care provider how to quit. If you do not use tobacco, do not start.  Lung cancer screening is recommended for adults aged 55 80 years who are at high risk for developing lung cancer because of a history of smoking. A yearly low-dose CT scan of the lungs is recommended for people who have at least a 30-pack-year history of smoking and are a current smoker or have quit within the past 15 years. A pack year of smoking is smoking an average of 1 pack of cigarettes a day for 1 year (for example: 1 pack a day for 30 years or 2 packs a day for 15 years). Yearly screening should continue until the smoker has stopped smoking for at least 15 years. Yearly screening should be stopped for people who develop a health problem that would prevent them from having lung cancer treatment.  If you are pregnant, do not drink alcohol. If you   are breastfeeding, be very cautious about drinking alcohol. If you are not pregnant and choose to drink alcohol, do not have more than 1 drink per day. One drink is considered to be 12 ounces (355 mL) of beer, 5 ounces (148 mL) of wine, or 1.5 ounces (44 mL) of liquor.  Avoid use of street drugs. Do not share needles with anyone. Ask for help if you need support or instructions about stopping the use of drugs.  High blood pressure causes heart disease and  increases the risk of stroke. Your blood pressure should be checked at least every 1 to 2 years. Ongoing high blood pressure should be treated with medicines if weight loss and exercise do not work.  If you are 20 29 years old, ask your health care provider if you should take aspirin to prevent strokes.  Diabetes screening involves taking a blood sample to check your fasting blood sugar level. This should be done once every 3 years, after age 35, if you are within normal weight and without risk factors for diabetes. Testing should be considered at a younger age or be carried out more frequently if you are overweight and have at least 1 risk factor for diabetes.  Breast cancer screening is essential preventive care for women. You should practice "breast self-awareness." This means understanding the normal appearance and feel of your breasts and may include breast self-examination. Any changes detected, no matter how small, should be reported to a health care provider. Women in their 42s and 30s should have a clinical breast exam (CBE) by a health care provider as part of a regular health exam every 1 to 3 years. After age 74, women should have a CBE every year. Starting at age 43, women should consider having a mammogram (breast X-ray test) every year. Women who have a family history of breast cancer should talk to their health care provider about genetic screening. Women at a high risk of breast cancer should talk to their health care providers about having an MRI and a mammogram every year.  Breast cancer gene (BRCA)-related cancer risk assessment is recommended for women who have family members with BRCA-related cancers. BRCA-related cancers include breast, ovarian, tubal, and peritoneal cancers. Having family members with these cancers may be associated with an increased risk for harmful changes (mutations) in the breast cancer genes BRCA1 and BRCA2. Results of the assessment will determine the need for  genetic counseling and BRCA1 and BRCA2 testing.  The Pap test is a screening test for cervical cancer. A Pap test can show cell changes on the cervix that might become cervical cancer if left untreated. A Pap test is a procedure in which cells are obtained and examined from the lower end of the uterus (cervix).  Women should have a Pap test starting at age 60.  Between ages 63 and 62, Pap tests should be repeated every 2 years.  Beginning at age 43, you should have a Pap test every 3 years as long as the past 3 Pap tests have been normal.  Some women have medical problems that increase the chance of getting cervical cancer. Talk to your health care provider about these problems. It is especially important to talk to your health care provider if a new problem develops soon after your last Pap test. In these cases, your health care provider may recommend more frequent screening and Pap tests.  The above recommendations are the same for women who have or have not gotten the vaccine  for human papillomavirus (HPV).  If you had a hysterectomy for a problem that was not cancer or a condition that could lead to cancer, then you no longer need Pap tests. Even if you no longer need a Pap test, a regular exam is a good idea to make sure no other problems are starting.  If you are between ages 65 and 70 years, and you have had normal Pap tests going back 10 years, you no longer need Pap tests. Even if you no longer need a Pap test, a regular exam is a good idea to make sure no other problems are starting.  If you have had past treatment for cervical cancer or a condition that could lead to cancer, you need Pap tests and screening for cancer for at least 20 years after your treatment.  If Pap tests have been discontinued, risk factors (such as a new sexual partner) need to be reassessed to determine if screening should be resumed.  The HPV test is an additional test that may be used for cervical cancer  screening. The HPV test looks for the virus that can cause the cell changes on the cervix. The cells collected during the Pap test can be tested for HPV. The HPV test could be used to screen women aged 30 years and older, and should be used in women of any age who have unclear Pap test results. After the age of 30, women should have HPV testing at the same frequency as a Pap test.  Colorectal cancer can be detected and often prevented. Most routine colorectal cancer screening begins at the age of 50 years and continues through age 75 years. However, your health care provider may recommend screening at an earlier age if you have risk factors for colon cancer. On a yearly basis, your health care provider may provide home test kits to check for hidden blood in the stool. Use of a small camera at the end of a tube, to directly examine the colon (sigmoidoscopy or colonoscopy), can detect the earliest forms of colorectal cancer. Talk to your health care provider about this at age 50, when routine screening begins. Direct exam of the colon should be repeated every 5 10 years through age 75 years, unless early forms of pre-cancerous polyps or small growths are found.  People who are at an increased risk for hepatitis B should be screened for this virus. You are considered at high risk for hepatitis B if:  You were born in a country where hepatitis B occurs often. Talk with your health care provider about which countries are considered high risk.  Your parents were born in a high-risk country and you have not received a shot to protect against hepatitis B (hepatitis B vaccine).  You have HIV or AIDS.  You use needles to inject street drugs.  You live with, or have sex with, someone who has Hepatitis B.  You get hemodialysis treatment.  You take certain medicines for conditions like cancer, organ transplantation, and autoimmune conditions.  Hepatitis C blood testing is recommended for all people born from  1945 through 1965 and any individual with known risks for hepatitis C.  Practice safe sex. Use condoms and avoid high-risk sexual practices to reduce the spread of sexually transmitted infections (STIs). STIs include gonorrhea, chlamydia, syphilis, trichomonas, herpes, HPV, and human immunodeficiency virus (HIV). Herpes, HIV, and HPV are viral illnesses that have no cure. They can result in disability, cancer, and death. Sexually active women aged 25   years and younger should be checked for chlamydia. Older women with new or multiple partners should also be tested for chlamydia. Testing for other STIs is recommended if you are sexually active and at increased risk.  Osteoporosis is a disease in which the bones lose minerals and strength with aging. This can result in serious bone fractures or breaks. The risk of osteoporosis can be identified using a bone density scan. Women ages 65 years and over and women at risk for fractures or osteoporosis should discuss screening with their health care providers. Ask your health care provider whether you should take a calcium supplement or vitamin D to reduce the rate of osteoporosis.  Menopause can be associated with physical symptoms and risks. Hormone replacement therapy is available to decrease symptoms and risks. You should talk to your health care provider about whether hormone replacement therapy is right for you.  Use sunscreen. Apply sunscreen liberally and repeatedly throughout the day. You should seek shade when your shadow is shorter than you. Protect yourself by wearing long sleeves, pants, a wide-brimmed hat, and sunglasses year round, whenever you are outdoors.  Once a month, do a whole body skin exam, using a mirror to look at the skin on your back. Tell your health care provider of new moles, moles that have irregular borders, moles that are larger than a pencil eraser, or moles that have changed in shape or color.  Stay current with required  vaccines (immunizations).  Influenza vaccine. All adults should be immunized every year.  Tetanus, diphtheria, and acellular pertussis (Td, Tdap) vaccine. Pregnant women should receive 1 dose of Tdap vaccine during each pregnancy. The dose should be obtained regardless of the length of time since the last dose. Immunization is preferred during the 27th 36th week of gestation. An adult who has not previously received Tdap or who does not know her vaccine status should receive 1 dose of Tdap. This initial dose should be followed by tetanus and diphtheria toxoids (Td) booster doses every 10 years. Adults with an unknown or incomplete history of completing a 3-dose immunization series with Td-containing vaccines should begin or complete a primary immunization series including a Tdap dose. Adults should receive a Td booster every 10 years.  Varicella vaccine. An adult without evidence of immunity to varicella should receive 2 doses or a second dose if she has previously received 1 dose. Pregnant females who do not have evidence of immunity should receive the first dose after pregnancy. This first dose should be obtained before leaving the health care facility. The second dose should be obtained 4 8 weeks after the first dose.  Human papillomavirus (HPV) vaccine. Females aged 13 26 years who have not received the vaccine previously should obtain the 3-dose series. The vaccine is not recommended for use in pregnant females. However, pregnancy testing is not needed before receiving a dose. If a female is found to be pregnant after receiving a dose, no treatment is needed. In that case, the remaining doses should be delayed until after the pregnancy. Immunization is recommended for any person with an immunocompromised condition through the age of 26 years if she did not get any or all doses earlier. During the 3-dose series, the second dose should be obtained 4 8 weeks after the first dose. The third dose should be  obtained 24 weeks after the first dose and 16 weeks after the second dose.  Zoster vaccine. One dose is recommended for adults aged 60 years or older unless certain   conditions are present.  Measles, mumps, and rubella (MMR) vaccine. Adults born before 1957 generally are considered immune to measles and mumps. Adults born in 1957 or later should have 1 or more doses of MMR vaccine unless there is a contraindication to the vaccine or there is laboratory evidence of immunity to each of the three diseases. A routine second dose of MMR vaccine should be obtained at least 28 days after the first dose for students attending postsecondary schools, health care workers, or international travelers. People who received inactivated measles vaccine or an unknown type of measles vaccine during 1963 1967 should receive 2 doses of MMR vaccine. People who received inactivated mumps vaccine or an unknown type of mumps vaccine before 1979 and are at high risk for mumps infection should consider immunization with 2 doses of MMR vaccine. For females of childbearing age, rubella immunity should be determined. If there is no evidence of immunity, females who are not pregnant should be vaccinated. If there is no evidence of immunity, females who are pregnant should delay immunization until after pregnancy. Unvaccinated health care workers born before 1957 who lack laboratory evidence of measles, mumps, or rubella immunity or laboratory confirmation of disease should consider measles and mumps immunization with 2 doses of MMR vaccine or rubella immunization with 1 dose of MMR vaccine.  Pneumococcal 13-valent conjugate (PCV13) vaccine. When indicated, a person who is uncertain of her immunization history and has no record of immunization should receive the PCV13 vaccine. An adult aged 19 years or older who has certain medical conditions and has not been previously immunized should receive 1 dose of PCV13 vaccine. This PCV13 should be  followed with a dose of pneumococcal polysaccharide (PPSV23) vaccine. The PPSV23 vaccine dose should be obtained at least 8 weeks after the dose of PCV13 vaccine. An adult aged 19 years or older who has certain medical conditions and previously received 1 or more doses of PPSV23 vaccine should receive 1 dose of PCV13. The PCV13 vaccine dose should be obtained 1 or more years after the last PPSV23 vaccine dose.  Pneumococcal polysaccharide (PPSV23) vaccine. When PCV13 is also indicated, PCV13 should be obtained first. All adults aged 65 years and older should be immunized. An adult younger than age 65 years who has certain medical conditions should be immunized. Any person who resides in a nursing home or long-term care facility should be immunized. An adult smoker should be immunized. People with an immunocompromised condition and certain other conditions should receive both PCV13 and PPSV23 vaccines. People with human immunodeficiency virus (HIV) infection should be immunized as soon as possible after diagnosis. Immunization during chemotherapy or radiation therapy should be avoided. Routine use of PPSV23 vaccine is not recommended for American Indians, [place] Natives, or people younger than 65 years unless there are medical conditions that require PPSV23 vaccine. When indicated, people who have unknown immunization and have no record of immunization should receive PPSV23 vaccine. One-time revaccination 5 years after the first dose of PPSV23 is recommended for people aged 19 64 years who have chronic kidney failure, nephrotic syndrome, asplenia, or immunocompromised conditions. People who received 1 2 doses of PPSV23 before age 65 years should receive another dose of PPSV23 vaccine at age 65 years or later if at least 5 years have passed since the previous dose. Doses of PPSV23 are not needed for people immunized with PPSV23 at or after age 65 years.  Meningococcal vaccine. Adults with asplenia or persistent  complement component deficiencies should receive 2   doses of quadrivalent meningococcal conjugate (MenACWY-D) vaccine. The doses should be obtained at least 2 months apart. Microbiologists working with certain meningococcal bacteria, military recruits, people at risk during an outbreak, and people who travel to or live in countries with a high rate of meningitis should be immunized. A first-year college student up through age 21 years who is living in a residence hall should receive a dose if she did not receive a dose on or after her 16th birthday. Adults who have certain high-risk conditions should receive one or more doses of vaccine.  Hepatitis A vaccine. Adults who wish to be protected from this disease, have certain high-risk conditions, work with hepatitis A-infected animals, work in hepatitis A research labs, or travel to or work in countries with a high rate of hepatitis A should be immunized. Adults who were previously unvaccinated and who anticipate close contact with an international adoptee during the first 60 days after arrival in the [place] from a country with a high rate of hepatitis A should be immunized.  Hepatitis B vaccine. Adults who wish to be protected from this disease, have certain high-risk conditions, may be exposed to blood or other infectious body fluids, are household contacts or sex partners of hepatitis B positive people, are clients or workers in certain care facilities, or travel to or work in countries with a high rate of hepatitis B should be immunized.  Haemophilus influenzae type b (Hib) vaccine. A previously unvaccinated person with asplenia or sickle cell disease or having a scheduled splenectomy should receive 1 dose of Hib vaccine. Regardless of previous immunization, a recipient of a hematopoietic stem cell transplant should receive a 3-dose series 6 12 months after her successful transplant. Hib vaccine is not recommended for adults with HIV  infection. Preventive Services / Frequency Ages 19 to 39years  Blood pressure check.** / Every 1 to 2 years.  Lipid and cholesterol check.** / Every 5 years beginning at age 20.  Clinical breast exam.** / Every 3 years for women in their 20s and 30s.  BRCA-related cancer risk assessment.** / For women who have family members with a BRCA-related cancer (breast, ovarian, tubal, or peritoneal cancers).  Pap test.** / Every 2 years from ages 21 through 29. Every 3 years starting at age 30 through age 65 or 70 with a history of 3 consecutive normal Pap tests.  HPV screening.** / Every 3 years from ages 30 through ages 65 to 70 with a history of 3 consecutive normal Pap tests.  Hepatitis C blood test.** / For any individual with known risks for hepatitis C.  Skin self-exam. / Monthly.  Influenza vaccine. / Every year.  Tetanus, diphtheria, and acellular pertussis (Tdap, Td) vaccine.** / Consult your health care provider. Pregnant women should receive 1 dose of Tdap vaccine during each pregnancy. 1 dose of Td every 10 years.  Varicella vaccine.** / Consult your health care provider. Pregnant females who do not have evidence of immunity should receive the first dose after pregnancy.  HPV vaccine. / 3 doses over 6 months, if 26 and younger. The vaccine is not recommended for use in pregnant females. However, pregnancy testing is not needed before receiving a dose.  Measles, mumps, rubella (MMR) vaccine.** / You need at least 1 dose of MMR if you were born in 1957 or later. You may also need a 2nd dose. For females of childbearing age, rubella immunity should be determined. If there is no evidence of immunity, females who are not   pregnant should be vaccinated. If there is no evidence of immunity, females who are pregnant should delay immunization until after pregnancy.  Pneumococcal 13-valent conjugate (PCV13) vaccine.** / Consult your health care provider.  Pneumococcal polysaccharide (PPSV23)  vaccine.** / 1 to 2 doses if you smoke cigarettes or if you have certain conditions.  Meningococcal vaccine.** / 1 dose if you are age 88 to 41 years and a Market researcher living in a residence hall, or have one of several medical conditions, you need to get vaccinated against meningococcal disease. You may also need additional booster doses.  Hepatitis A vaccine.** / Consult your health care provider.  Hepatitis B vaccine.** / Consult your health care provider.  Haemophilus influenzae type b (Hib) vaccine.** / Consult your health care provider. Ages 45 to 64years  Blood pressure check.** / Every 1 to 2 years.  Lipid and cholesterol check.** / Every 5 years beginning at age 2 years.  Lung cancer screening. / Every year if you are aged 10 80 years and have a 30-pack-year history of smoking and currently smoke or have quit within the past 15 years. Yearly screening is stopped once you have quit smoking for at least 15 years or develop a health problem that would prevent you from having lung cancer treatment.  Clinical breast exam.** / Every year after age 28 years.  BRCA-related cancer risk assessment.** / For women who have family members with a BRCA-related cancer (breast, ovarian, tubal, or peritoneal cancers).  Mammogram.** / Every year beginning at age 63 years and continuing for as long as you are in good health. Consult with your health care provider.  Pap test.** / Every 3 years starting at age 71 years through age 39 or 90 years with a history of 3 consecutive normal Pap tests.  HPV screening.** / Every 3 years from ages 27 years through ages 32 to 72 years with a history of 3 consecutive normal Pap tests.  Fecal occult blood test (FOBT) of stool. / Every year beginning at age 40 years and continuing until age 29 years. You may not need to do this test if you get a colonoscopy every 10 years.  Flexible sigmoidoscopy or colonoscopy.** / Every 5 years for a flexible  sigmoidoscopy or every 10 years for a colonoscopy beginning at age 66 years and continuing until age 47 years.  Hepatitis C blood test.** / For all people born from [birth year] through 1966 and any individual with known risks for hepatitis C.  Skin self-exam. / Monthly.  Influenza vaccine. / Every year.  Tetanus, diphtheria, and acellular pertussis (Tdap/Td) vaccine.** / Consult your health care provider. Pregnant women should receive 1 dose of Tdap vaccine during each pregnancy. 1 dose of Td every 10 years.  Varicella vaccine.** / Consult your health care provider. Pregnant females who do not have evidence of immunity should receive the first dose after pregnancy.  Zoster vaccine.** / 1 dose for adults aged 39 years or older.  Measles, mumps, rubella (MMR) vaccine.** / You need at least 1 dose of MMR if you were born in 1958 or later. You may also need a 2nd dose. For females of childbearing age, rubella immunity should be determined. If there is no evidence of immunity, females who are not pregnant should be vaccinated. If there is no evidence of immunity, females who are pregnant should delay immunization until after pregnancy.  Pneumococcal 13-valent conjugate (PCV13) vaccine.** / Consult your health care provider.  Pneumococcal polysaccharide (PPSV23) vaccine.** / 1  to 2 doses if you smoke cigarettes or if you have certain conditions.  Meningococcal vaccine.** / Consult your health care provider.  Hepatitis A vaccine.** / Consult your health care provider.  Hepatitis B vaccine.** / Consult your health care provider.  Haemophilus influenzae type b (Hib) vaccine.** / Consult your health care provider. Ages 65 years and over  Blood pressure check.** / Every 1 to 2 years.  Lipid and cholesterol check.** / Every 5 years beginning at age 20 years.  Lung cancer screening. / Every year if you are aged 55 80 years and have a 30-pack-year history of smoking and currently smoke or have quit within the past 15 years.  Yearly screening is stopped once you have quit smoking for at least 15 years or develop a health problem that would prevent you from having lung cancer treatment.  Clinical breast exam.** / Every year after age 40 years.  BRCA-related cancer risk assessment.** / For women who have family members with a BRCA-related cancer (breast, ovarian, tubal, or peritoneal cancers).  Mammogram.** / Every year beginning at age 40 years and continuing for as long as you are in good health. Consult with your health care provider.  Pap test.** / Every 3 years starting at age 30 years through age 65 or 70 years with 3 consecutive normal Pap tests. Testing can be stopped between 65 and 70 years with 3 consecutive normal Pap tests and no abnormal Pap or HPV tests in the past 10 years.  HPV screening.** / Every 3 years from ages 30 years through ages 65 or 70 years with a history of 3 consecutive normal Pap tests. Testing can be stopped between 65 and 70 years with 3 consecutive normal Pap tests and no abnormal Pap or HPV tests in the past 10 years.  Fecal occult blood test (FOBT) of stool. / Every year beginning at age 50 years and continuing until age 75 years. You may not need to do this test if you get a colonoscopy every 10 years.  Flexible sigmoidoscopy or colonoscopy.** / Every 5 years for a flexible sigmoidoscopy or every 10 years for a colonoscopy beginning at age 50 years and continuing until age 75 years.  Hepatitis C blood test.** / For all people born from 1945 through 1965 and any individual with known risks for hepatitis C.  Osteoporosis screening.** / A one-time screening for women ages 65 years and over and women at risk for fractures or osteoporosis.  Skin self-exam. / Monthly.  Influenza vaccine. / Every year.  Tetanus, diphtheria, and acellular pertussis (Tdap/Td) vaccine.** / 1 dose of Td every 10 years.  Varicella vaccine.** / Consult your health care provider.  Zoster vaccine.** / 1  dose for adults aged 60 years or older.  Pneumococcal 13-valent conjugate (PCV13) vaccine.** / Consult your health care provider.  Pneumococcal polysaccharide (PPSV23) vaccine.** / 1 dose for all adults aged 65 years and older.  Meningococcal vaccine.** / Consult your health care provider.  Hepatitis A vaccine.** / Consult your health care provider.  Hepatitis B vaccine.** / Consult your health care provider.  Haemophilus influenzae type b (Hib) vaccine.** / Consult your health care provider. ** Family history and personal history of risk and conditions may change your health care provider's recommendations. Document Released: 10/10/2001 Document Revised: 06/04/2013 Document Reviewed: 01/09/2011 [employer] Patient Information 2014 [employer].  

## 2014-01-28 ENCOUNTER — Other Ambulatory Visit: Payer: BC Managed Care – PPO

## 2014-01-28 DIAGNOSIS — R7611 Nonspecific reaction to tuberculin skin test without active tuberculosis: Secondary | ICD-10-CM

## 2014-01-29 ENCOUNTER — Encounter (INDEPENDENT_AMBULATORY_CARE_PROVIDER_SITE_OTHER): Payer: Self-pay

## 2014-01-29 ENCOUNTER — Telehealth: Payer: Self-pay | Admitting: Family Medicine

## 2014-01-29 ENCOUNTER — Ambulatory Visit
Admission: RE | Admit: 2014-01-29 | Discharge: 2014-01-29 | Disposition: A | Discharge status: Home / Self Care | Payer: BC Managed Care – PPO | Source: Ambulatory Visit | Attending: Family Medicine | Admitting: Family Medicine

## 2014-01-29 DIAGNOSIS — R7611 Nonspecific reaction to tuberculin skin test without active tuberculosis: Secondary | ICD-10-CM

## 2014-01-29 NOTE — Telephone Encounter (Signed)
Pt called for xray results, advised all is negative.  Pt req copy emailed to [email or web address].     done

## 2014-02-24 ENCOUNTER — Ambulatory Visit: Payer: BC Managed Care – PPO | Admitting: Medical

## 2014-04-20 ENCOUNTER — Encounter: Payer: Self-pay | Admitting: Medical

## 2014-04-20 ENCOUNTER — Ambulatory Visit (INDEPENDENT_AMBULATORY_CARE_PROVIDER_SITE_OTHER): Payer: BC Managed Care – PPO | Admitting: Medical

## 2014-04-20 VITALS — BP 92/60 | HR 66 | Temp 97.9°F | Resp 16 | Wt 117.0 lb

## 2014-04-20 DIAGNOSIS — R059 Cough, unspecified: Secondary | ICD-10-CM

## 2014-04-20 DIAGNOSIS — R05 Cough: Secondary | ICD-10-CM

## 2014-04-20 MED ORDER — MONTELUKAST SODIUM 10 MG PO TABS: 10.0000 mg | ORAL_TABLET | Freq: Every day | ORAL | Status: DC

## 2014-04-20 MED ORDER — BENZONATATE 200 MG PO CAPS: 200.0000 mg | ORAL_CAPSULE | Freq: Three times a day (TID) | ORAL | Status: DC | PRN

## 2014-04-20 NOTE — Patient Instructions (Signed)
  Thank you for giving me the opportunity to serve you today.    Your diagnosis today includes: Encounter Diagnosis  Name Primary?  . Cough Yes     Specific recommendations today include:  Either continue Zyrtec or try switching to Allegra OTC  Begin Singulair tablet at bedtime nightly (prescription)  You may use Tessalon perle cough drop (prescription) 3 times daily for cough  Continue salt water gargles, neti pot, warm fluids  Avoid acidic/spicy/citrus foods/juices for now as this can aggravate cough  Drink plenty of water daily  If not improving within 2 weeks, certainly call back  Return if symptoms worsen or fail to improve.    I have included other useful information below for your review.    Cough, Adult  A cough is a reflex that helps clear your throat and airways. It can help heal the body or may be a reaction to an irritated airway. A cough may only last 2 or 3 weeks (acute) or may last more than 8 weeks (chronic).  CAUSES Acute cough:  Viral or bacterial infections. Chronic cough:  Infections.  Allergies.  Asthma.  Post-nasal drip.  Smoking.  Heartburn or acid reflux.  Some medicines.  Chronic lung problems (COPD).  Cancer. SYMPTOMS   Cough.  Fever.  Chest pain.  Increased breathing rate.  High-pitched whistling sound when breathing (wheezing).  Colored mucus that you cough up (sputum). TREATMENT   A bacterial cough may be treated with antibiotic medicine.  A viral cough must run its course and will not respond to antibiotics.  Your caregiver may recommend other treatments if you have a chronic cough. HOME CARE INSTRUCTIONS   Only take over-the-counter or prescription medicines for pain, discomfort, or fever as directed by your caregiver. Use cough suppressants only as directed by your caregiver.  Use a cold steam vaporizer or humidifier in your bedroom or home to help loosen secretions.  Sleep in a semi-upright position  if your cough is worse at night.  Rest as needed.  Stop smoking if you smoke. SEEK IMMEDIATE MEDICAL CARE IF:   You have pus in your sputum.  Your cough starts to worsen.  You cannot control your cough with suppressants and are losing sleep.  You begin coughing up blood.  You have difficulty breathing.  You develop pain which is getting worse or is uncontrolled with medicine.  You have a fever. MAKE SURE YOU:   Understand these instructions.  Will watch your condition.  Will get help right away if you are not doing well or get worse. Document Released: 02/10/2011 Document Revised: 11/06/2011 Document Reviewed: 02/10/2011 [facility] Patient Information 2015 [place], [place]. This information is not intended to replace advice given to you by your health care provider. Make sure you discuss any questions you have with your health care provider.

## 2014-04-20 NOTE — Progress Notes (Signed)
   Subjective:   Haley Herrera is a 28 y.o. female presenting on 04/20/2014 with Cough   She reports 3 week hx/o cough.  Started out as a cold 3 wk ago, but the cough has lingered.  initially was using neti pot, mucinex DM.  In the last few days having cough, some sore throat.   No production.  Doesn't feel like she has post nasal drainage.  No sinus pressure, no ear pain, deneis SOB, wheezing, chest tightness.  In the morning throat hurts worse.  Cough is mostly in the day.  Cough doesn't wake her up.   She is a nonsmoker.   Denies fever, no NVD, no abdominal pain.  Husband has mentioned mold smell around the air conditioning.  She does eat acidic foods, has hx/o gastric ulcer in the past.  Drinks glass of wine most nights.  No other aggravating or relieving factors.  No other complaint.  Review of Systems ROS as in subjective    Objective:   Filed Vitals:   04/20/14 1341  BP: 92/60  Pulse: 66  Temp: 97.9 F (36.6 C)  Resp: 16    General appearance: alert, no distress, WD/WN HEENT: normocephalic, sclerae anicteric, TMs pearly, nares patent, no discharge or erythema, pharynx normal Oral cavity: MMM, no lesions, froth in back of throat Neck: supple, no lymphadenopathy, no thyromegaly, no masses Heart: RRR, normal S1, S2, no murmurs Lungs: CTA bilaterally, no wheezes, rhonchi, or rales Abdomen: +bs, soft, mild epigastric tenderness, otherwise non tender, non distended, no masses, no hepatomegaly, no splenomegaly Pulses: 2+ symmetric, upper and lower extremities, normal cap refill      Assessment: Encounter Diagnosis  Name Primary?  . Cough Yes     Plan: Discussed her symptoms and concerns, common causes of lingering cough. We discussed treatment recommendations, trying to help rule out causes of eliminating triggers.    Specific recommendations today include:  Either continue Zyrtec or try switching to Allegra OTC  Begin Singulair tablet at bedtime nightly  (prescription)  You may use Tessalon perle cough drop (prescription) 3 times daily for cough  Continue salt water gargles, nettie pot, warm fluids  Avoid acidic/spicy/citrus foods/juices for now as this can aggravate cough  Drink plenty of water daily  If not improving within 2 weeks, certainly call back  Jessette was seen today for cough.  Diagnoses and associated orders for this visit:  Cough  Other Orders - benzonatate (TESSALON) 200 MG capsule; Take 1 capsule (200 mg total) by mouth 3 (three) times daily as needed for cough. - montelukast (SINGULAIR) 10 MG tablet; Take 1 tablet (10 mg total) by mouth at bedtime.     Return if symptoms worsen or fail to improve.

## 2014-05-26 ENCOUNTER — Ambulatory Visit (INDEPENDENT_AMBULATORY_CARE_PROVIDER_SITE_OTHER): Payer: BC Managed Care – PPO | Admitting: Family Medicine

## 2014-05-26 ENCOUNTER — Encounter: Payer: Self-pay | Admitting: Family Medicine

## 2014-05-26 ENCOUNTER — Telehealth: Payer: Self-pay | Admitting: Internal Medicine

## 2014-05-26 VITALS — BP 96/56 | HR 69 | Ht 59.0 in | Wt 114.0 lb

## 2014-05-26 DIAGNOSIS — N939 Abnormal uterine and vaginal bleeding, unspecified: Secondary | ICD-10-CM | POA: Insufficient documentation

## 2014-05-26 DIAGNOSIS — N898 Other specified noninflammatory disorders of vagina: Secondary | ICD-10-CM

## 2014-05-26 NOTE — Telephone Encounter (Signed)
Pt called and states that she needs MMR, Varicella,Tdap and Hep B for school. However her records are in the [place] and she can not access those records. She states she knows she had chicken poxs as a kid and had tdap but can't remember when she had it or if it has been within the last 10 years. She is calling to check if her insurance will pay for the Shots. Does she need to do the Titers for the MMR, Varicella and Hep B. Her insurance will not pay for titers so it will be  $161.00 for those 3 titers out of pocket. Do you want to do the titers or just get the shots.

## 2014-05-26 NOTE — Assessment & Plan Note (Signed)
Related to Mirena--not interested in non-LARC--other options likely to be worse.  Will stay with Mirena.

## 2014-05-26 NOTE — Progress Notes (Signed)
    Subjective:    Patient ID: Haley Herrera is a 28 y.o. female presenting with Contraception  on 05/26/2014  HPI: Has some spotting with Mirena IUD and it is unpredictable. Has had previous problems with Depo---interested in Nexplanon  Review of Systems  Constitutional: Negative for fever and chills.  Respiratory: Negative for shortness of breath.   Cardiovascular: Negative for chest pain.  Gastrointestinal: Negative for nausea, vomiting and abdominal pain.  Genitourinary: Negative for dysuria.  Skin: Negative for rash.      Objective:    BP 96/56  Pulse 69  Ht 4\' 11"  (1.499 m)  Wt 114 lb (51.71 kg)  BMI 23.01 kg/m2 Physical Exam  Constitutional: She is oriented to person, place, and time. She appears well-developed and well-nourished. No distress.  HENT:  Head: Normocephalic and atraumatic.  Eyes: No scleral icterus.  Neck: Neck supple.  Cardiovascular: Normal rate.   Pulmonary/Chest: Effort normal.  Abdominal: Soft.  Neurological: She is alert and oriented to person, place, and time.  Skin: Skin is warm and dry.  Psychiatric: She has a normal mood and affect.        Assessment & Plan:   Vaginal spotting Related to Mirena--not interested in non-LARC--other options likely to be worse.  Will stay with Mirena.    Return for a follow-up.

## 2014-05-26 NOTE — Patient Instructions (Signed)
Levonorgestrel intrauterine device (IUD) What is this medicine? LEVONORGESTREL IUD (LEE voe nor jes trel) is a contraceptive (birth control) device. The device is placed inside the uterus by a healthcare professional. It is used to prevent pregnancy and can also be used to treat heavy bleeding that occurs during your period. Depending on the device, it can be used for 3 to 5 years. This medicine may be used for other purposes; ask your health care provider or pharmacist if you have questions. COMMON BRAND NAME(S): LILETTA, Mirena, Skyla What should I tell my health care provider before I take this medicine? They need to know if you have any of these conditions: -abnormal Pap smear -cancer of the breast, uterus, or cervix -diabetes -endometritis -genital or pelvic infection now or in the past -have more than one sexual partner or your partner has more than one partner -heart disease -history of an ectopic or tubal pregnancy -immune system problems -IUD in place -liver disease or tumor -problems with blood clots or take blood-thinners -use intravenous drugs -uterus of unusual shape -vaginal bleeding that has not been explained -an unusual or allergic reaction to levonorgestrel, other hormones, silicone, or polyethylene, medicines, foods, dyes, or preservatives -pregnant or trying to get pregnant -breast-feeding How should I use this medicine? This device is placed inside the uterus by a health care professional. Talk to your pediatrician regarding the use of this medicine in children. Special care may be needed. Overdosage: If you think you have taken too much of this medicine contact a poison control center or emergency room at once. NOTE: This medicine is only for you. Do not share this medicine with others. What if I miss a dose? This does not apply. What may interact with this medicine? Do not take this medicine with any of the following  medications: -amprenavir -bosentan -fosamprenavir This medicine may also interact with the following medications: -aprepitant -barbiturate medicines for inducing sleep or treating seizures -bexarotene -griseofulvin -medicines to treat seizures like carbamazepine, ethotoin, felbamate, oxcarbazepine, phenytoin, topiramate -modafinil -pioglitazone -rifabutin -rifampin -rifapentine -some medicines to treat HIV infection like atazanavir, indinavir, lopinavir, nelfinavir, tipranavir, ritonavir -St. John's wort -warfarin This list may not describe all possible interactions. Give your health care provider a list of all the medicines, herbs, non-prescription drugs, or dietary supplements you use. Also tell them if you smoke, drink alcohol, or use illegal drugs. Some items may interact with your medicine. What should I watch for while using this medicine? Visit your doctor or health care professional for regular check ups. See your doctor if you or your partner has sexual contact with others, becomes HIV positive, or gets a sexual transmitted disease. This product does not protect you against HIV infection (AIDS) or other sexually transmitted diseases. You can check the placement of the IUD yourself by reaching up to the top of your vagina with clean fingers to feel the threads. Do not pull on the threads. It is a good habit to check placement after each menstrual period. Call your doctor right away if you feel more of the IUD than just the threads or if you cannot feel the threads at all. The IUD may come out by itself. You may become pregnant if the device comes out. If you notice that the IUD has come out use a backup birth control method like condoms and call your health care provider. Using tampons will not change the position of the IUD and are okay to use during your period. What side effects may   I notice from receiving this medicine? Side effects that you should report to your doctor or  health care professional as soon as possible: -allergic reactions like skin rash, itching or hives, swelling of the face, lips, or tongue -fever, flu-like symptoms -genital sores -high blood pressure -no menstrual period for 6 weeks during use -pain, swelling, warmth in the leg -pelvic pain or tenderness -severe or sudden headache -signs of pregnancy -stomach cramping -sudden shortness of breath -trouble with balance, talking, or walking -unusual vaginal bleeding, discharge -yellowing of the eyes or skin Side effects that usually do not require medical attention (report to your doctor or health care professional if they continue or are bothersome): -acne -breast pain -change in sex drive or performance -changes in weight -cramping, dizziness, or faintness while the device is being inserted -headache -irregular menstrual bleeding within first 3 to 6 months of use -nausea This list may not describe all possible side effects. Call your doctor for medical advice about side effects. You may report side effects to FDA at 1-800-FDA-1088. Where should I keep my medicine? This does not apply. NOTE: This sheet is a summary. It may not cover all possible information. If you have questions about this medicine, talk to your doctor, pharmacist, or health care provider.  2015, [employer] Standard. (2011-09-14 13:54:04)  

## 2014-05-26 NOTE — Telephone Encounter (Signed)
Pt did call her insurance company and the shots are all covered by insurances. What did you want to do?

## 2014-05-27 ENCOUNTER — Telehealth: Payer: Self-pay | Admitting: Medical

## 2014-05-27 ENCOUNTER — Other Ambulatory Visit (INDEPENDENT_AMBULATORY_CARE_PROVIDER_SITE_OTHER): Payer: BC Managed Care – PPO

## 2014-05-27 DIAGNOSIS — Z23 Encounter for immunization: Secondary | ICD-10-CM

## 2014-05-27 NOTE — Telephone Encounter (Signed)
Pt came in for nurse visit and dropped off form to be completed. Sending back for completion.

## 2014-05-27 NOTE — Telephone Encounter (Signed)
Done, copy and return form to patient

## 2014-05-27 NOTE — Telephone Encounter (Signed)
She is up to date on Tdap, 2007 per chart record.  If she can't get her prior records and if having to pay cash for labs, then just have her come in for the following:  MMR #1, and MMR #2 in 2 months Varicella booster/vaccine x 1. Hep B #1 with plans to complete the series, return in 2 months for Hep B#2, 6 months for Hep B#3. And yearly flu shot if not done already.

## 2014-05-27 NOTE — Telephone Encounter (Signed)
Patient forms are on your desk.

## 2014-05-27 NOTE — Telephone Encounter (Signed)
Pt is coming by today to get MMR #1, Hep B #1, Varicella booster and then possibly a flu shot.

## 2014-05-28 NOTE — Telephone Encounter (Signed)
Left a message that her forms are ready for her to pick up. Did make a copy of the forms also.

## 2014-06-29 ENCOUNTER — Encounter: Payer: Self-pay | Admitting: Family Medicine

## 2014-07-27 ENCOUNTER — Other Ambulatory Visit: Payer: BC Managed Care – PPO

## 2014-10-19 ENCOUNTER — Other Ambulatory Visit (INDEPENDENT_AMBULATORY_CARE_PROVIDER_SITE_OTHER): Payer: BLUE CROSS/BLUE SHIELD

## 2014-10-19 DIAGNOSIS — Z23 Encounter for immunization: Secondary | ICD-10-CM

## 2014-10-19 MED ORDER — MEASLES, MUMPS & RUBELLA VAC ~~LOC~~ INJ: 0.5000 mL | INJECTION | Freq: Once | SUBCUTANEOUS | Status: DC

## 2014-10-27 ENCOUNTER — Ambulatory Visit (INDEPENDENT_AMBULATORY_CARE_PROVIDER_SITE_OTHER): Payer: BLUE CROSS/BLUE SHIELD | Admitting: Obstetrics & Gynecology

## 2014-10-27 ENCOUNTER — Encounter: Payer: Self-pay | Admitting: Obstetrics & Gynecology

## 2014-10-27 VITALS — BP 110/71 | HR 63 | Ht 59.0 in | Wt 117.0 lb

## 2014-10-27 DIAGNOSIS — Z124 Encounter for screening for malignant neoplasm of cervix: Secondary | ICD-10-CM

## 2014-10-27 DIAGNOSIS — Z0000 Encounter for general adult medical examination without abnormal findings: Secondary | ICD-10-CM

## 2014-10-27 DIAGNOSIS — Z01419 Encounter for gynecological examination (general) (routine) without abnormal findings: Secondary | ICD-10-CM | POA: Diagnosis not present

## 2014-10-27 DIAGNOSIS — N898 Other specified noninflammatory disorders of vagina: Secondary | ICD-10-CM | POA: Diagnosis not present

## 2014-10-27 NOTE — Progress Notes (Signed)
Subjective:    Haley Herrera is a 29 y.o. MAsian P0 female who presents for an annual exam. The patient has no complaints today. The patient is sexually active. GYN screening history: last pap: was normal. The patient wears seatbelts: yes. The patient participates in regular exercise: yes. Has the patient ever been transfused or tattooed?: not asked. The patient reports that there is not domestic violence in her life.   Menstrual History: OB History    Gravida Para Term Preterm AB TAB SAB Ectopic Multiple Living   2 0 0 0 2 1 1 0 0 0       Menarche age: [age]  No LMP recorded. Patient is not currently having periods (Reason: IUD).    The following portions of the patient's history were reviewed and updated as appropriate: allergies, current medications, past family history, past medical history, past social history, past surgical history and problem list.  Review of Systems A comprehensive review of systems was negative. She has a non-regular period every month, one heavy day. Her husband does not want kids now. She had a flu vaccine this season.   Objective:    BP 110/71 mmHg  Pulse 63  Ht 4\' 11"  (1.499 m)  Wt 117 lb (53.071 kg)  BMI 23.62 kg/m2  General Appearance:    Alert, cooperative, no distress, appears stated age  Head:    Normocephalic, without obvious abnormality, atraumatic  Eyes:    PERRL, conjunctiva/corneas clear, EOM's intact, fundi    benign, both eyes  Ears:    Normal TM's and external ear canals, both ears  Nose:   Nares normal, septum midline, mucosa normal, no drainage    or sinus tenderness  Throat:   Lips, mucosa, and tongue normal; teeth and gums normal  Neck:   Supple, symmetrical, trachea midline, no adenopathy;    thyroid:  no enlargement/tenderness/nodules; no carotid   bruit or JVD  Back:     Symmetric, no curvature, ROM normal, no CVA tenderness  Lungs:     Clear to auscultation bilaterally, respirations unlabored  Chest Wall:    No tenderness or  deformity   Heart:    Regular rate and rhythm, S1 and S2 normal, no murmur, rub   or gallop  Breast Exam:    No tenderness, masses, or nipple abnormality  Abdomen:     Soft, non-tender, bowel sounds active all four quadrants,    no masses, no organomegaly  Genitalia:    Normal female without lesion, discharge or tenderness, Mirena strings seen, NSSmid plane, minimal mobility, NT, no adnexal masses palpable, frothy vaginal discharge     Extremities:   Extremities normal, atraumatic, no cyanosis or edema  Pulses:   2+ and symmetric all extremities  Skin:   Skin color, texture, turgor normal, no rashes or lesions  Lymph nodes:   Cervical, supraclavicular, and axillary nodes normal  Neurologic:   CNII-XII intact, normal strength, sensation and reflexes    throughout  .    Assessment:    Healthy female exam.   Frothy vaginal discharge   Plan:     Breast self exam technique reviewed and patient encouraged to perform self-exam monthly. Thin prep Pap smear.   Wet prep sent

## 2014-10-28 LAB — WET PREP, GENITAL
Clue Cells Wet Prep HPF POC: NONE SEEN
Trich, Wet Prep: NONE SEEN
Yeast Wet Prep HPF POC: NONE SEEN

## 2014-10-28 LAB — CYTOLOGY - PAP

## 2014-10-29 ENCOUNTER — Encounter: Payer: Self-pay | Admitting: Medical

## 2014-11-24 ENCOUNTER — Encounter: Payer: Self-pay | Admitting: Medical

## 2014-11-25 ENCOUNTER — Other Ambulatory Visit (INDEPENDENT_AMBULATORY_CARE_PROVIDER_SITE_OTHER): Payer: BLUE CROSS/BLUE SHIELD

## 2014-11-25 DIAGNOSIS — Z23 Encounter for immunization: Secondary | ICD-10-CM

## 2015-01-14 ENCOUNTER — Encounter: Payer: Self-pay | Admitting: Family Medicine

## 2015-01-14 ENCOUNTER — Ambulatory Visit (INDEPENDENT_AMBULATORY_CARE_PROVIDER_SITE_OTHER): Payer: BLUE CROSS/BLUE SHIELD | Admitting: Family Medicine

## 2015-01-14 VITALS — BP 92/68 | HR 65 | Temp 98.5°F | Wt 119.0 lb

## 2015-01-14 DIAGNOSIS — R1010 Upper abdominal pain, unspecified: Secondary | ICD-10-CM

## 2015-01-14 NOTE — Progress Notes (Signed)
   Subjective:    Patient ID: Haley Herrera, female    DOB: 12-01-1985, 29 y.o.   MRN: [id number]  HPI She complains of a one-day history of full sensation in the midepigastric area that she also describes as burning sensation with some nausea. She says that it decreased slightly when she lies completely still.She has had difficulty eating stating that anything in her stomach causes a burning sensation. She did try milk of magnesia and assess without much success. She apparently was diagnosed with peptic ulcer disease by an upper GI when still living in the [place] but did not take any medication for this. This was several years ago. She apparently has not had any difficulty recently with this.  Review of Systems     Objective:   Physical Exam Alert and in no distress. Cardiac exam shows regular rhythm without murmurs or gallops. Lungs are clear to auscultation. Abdominal exam shows active bowel sounds. No tenderness to palpation or rebound is noted.       Assessment & Plan:   Pain of upper abdomen This could potentially be a recurrence of an underlying ulcer. Recommend we start with 40 mg of Prilosec daily. Recommend she would ever she feels comfortable with. We will try to quiet her down with history of her symptoms worsen, may need to get more aggressive.

## 2015-01-14 NOTE — Patient Instructions (Signed)
Take 2 Prilosec daily. Heat whatever you're comfortable eating. Give it 5-7 days at least and if no improvement then make an appointment

## 2015-04-22 ENCOUNTER — Telehealth: Payer: Self-pay | Admitting: Medical

## 2015-04-22 NOTE — Telephone Encounter (Signed)
Pt called and requested fasting labs. Does she need to see you or can you put orders in? Please advise. Pt can be reached at [phone].

## 2015-04-23 NOTE — Telephone Encounter (Signed)
schedule a physical.  I think her last visit was over a year ago

## 2015-04-23 NOTE — Telephone Encounter (Signed)
PT scheduled a cpe. Will be in September.

## 2015-05-18 ENCOUNTER — Encounter: Payer: Self-pay | Admitting: Medical

## 2015-05-18 ENCOUNTER — Ambulatory Visit (INDEPENDENT_AMBULATORY_CARE_PROVIDER_SITE_OTHER): Payer: BLUE CROSS/BLUE SHIELD | Admitting: Medical

## 2015-05-18 VITALS — BP 92/70 | HR 80 | Temp 97.8°F | Resp 18 | Ht 59.0 in | Wt 120.2 lb

## 2015-05-18 DIAGNOSIS — Z7185 Encounter for immunization safety counseling: Secondary | ICD-10-CM

## 2015-05-18 DIAGNOSIS — Z7189 Other specified counseling: Secondary | ICD-10-CM

## 2015-05-18 DIAGNOSIS — Z0000 Encounter for general adult medical examination without abnormal findings: Secondary | ICD-10-CM

## 2015-05-18 DIAGNOSIS — L309 Dermatitis, unspecified: Secondary | ICD-10-CM | POA: Insufficient documentation

## 2015-05-18 LAB — COMPREHENSIVE METABOLIC PANEL WITH GFR
ALT: 17 U/L (ref 6–29)
AST: 19 U/L (ref 10–30)
Albumin: 4.3 g/dL (ref 3.6–5.1)
Alkaline Phosphatase: 40 U/L (ref 33–115)
BUN: 16 mg/dL (ref 7–25)
CO2: 25 mmol/L (ref 20–31)
Calcium: 9.1 mg/dL (ref 8.6–10.2)
Chloride: 103 mmol/L (ref 98–110)
Creat: 0.73 mg/dL (ref 0.50–1.10)
Glucose, Bld: 88 mg/dL (ref 65–99)
Potassium: 4.2 mmol/L (ref 3.5–5.3)
Sodium: 137 mmol/L (ref 135–146)
Total Bilirubin: 0.4 mg/dL (ref 0.2–1.2)
Total Protein: 7.1 g/dL (ref 6.1–8.1)

## 2015-05-18 LAB — CBC
HCT: 41.6 % (ref 36.0–46.0)
Hemoglobin: 14.1 g/dL (ref 12.0–15.0)
MCH: 29.6 pg (ref 26.0–34.0)
MCHC: 33.9 g/dL (ref 30.0–36.0)
MCV: 87.2 fL (ref 78.0–100.0)
MPV: 9.5 fL (ref 8.6–12.4)
Platelets: 233 K/uL (ref 150–400)
RBC: 4.77 MIL/uL (ref 3.87–5.11)
RDW: 13.5 % (ref 11.5–15.5)
WBC: 7.1 K/uL (ref 4.0–10.5)

## 2015-05-18 LAB — LIPID PANEL
Cholesterol: 197 mg/dL (ref 125–200)
HDL: 70 mg/dL (ref >=46)
LDL CALC: 117 mg/dL (ref <130)
TRIGLYCERIDES: 49 mg/dL (ref <150)
Total CHOL/HDL Ratio: 2.8 Ratio (ref <=5.0)
VLDL: 10 mg/dL (ref <30)

## 2015-05-18 MED ORDER — TRIAMCINOLONE ACETONIDE 0.1 % EX CREA: 1.0000 "application " | TOPICAL_CREAM | Freq: Two times a day (BID) | CUTANEOUS | Status: DC

## 2015-05-18 NOTE — Progress Notes (Signed)
Subjective:   HPI  Haley Herrera is a 29 y.o. female who presents for a complete physical.  Does nanny work. Feeling fine.   Likes to hike and camp.   Preventative care: Last ophthalmology visit:n/a Last dental visit:yes- [facility] Last colonoscopy:n/a Last mammogram:n/a Last gynecological exam:sees gyn, up to date on pap Last EKG:n/a Last labs:2012  Prior vaccinations: TD or Tdap:9 years ago Influenza:not today  Concerns: Still gets some dry itchy skin of some of her fingers. Using some over-the-counter hydrocortisone with improvement. At times uses various lotions over-the-counter  Neck is achy of late, seems to be related to sinus pressure.  Not using her neti pot currently but has had sinus infections prior.  Her and husband been using no carb, high protein/high fat diet.  Wants labs.     Reviewed their medical, surgical, family, social, medication, and allergy history and updated chart as appropriate.  Past Medical History  Diagnosis Date  . History of UTI   . IUD (intrauterine device) in place 06/2011    Mirena, Dr. Marice Potter  . History of gynecological procedure     TAB x 1, SAB x 2  . History of upper GI x-ray series 2008  . Gastric ulcer 2008  . Allergy     seasonal  . Eczema of hand     Past Surgical History  Procedure Laterality Date  . Wisdom tooth extraction    . Therapeutic abortion      Social History   Social History  . Marital Status: Single    Spouse Name: N/A  . Number of Children: N/A  . Years of Education: N/A   Occupational History  . nanny    Social History Main Topics  . Smoking status: Never Smoker   . Smokeless tobacco: Never Used  . Alcohol Use: 6.0 oz/week    3 Standard drinks or equivalent, 7 Glasses of wine per week  . Drug Use: No  . Sexual Activity:    Partners: Male    Birth Control/ Protection: Condom, IUD   Other Topics Concern  . Not on file   Social History Narrative   Married 06/2013, no children,  exercise - run, walk, weights, [employer],   [place], taking pre-requisites to get into nursing program.   Likes to hike and camp.      Family History  Problem Relation Age of Onset  . Hypertension Father   . Diabetes Maternal Grandmother   . Heart disease Maternal Grandfather   . Cancer Neg Hx   . Stroke Neg Hx      Current outpatient prescriptions:  .  Ascorbic Acid (VITAMIN C) 1000 MG tablet, Take 1,000 mg by mouth daily., Disp: , Rfl:  .  glucosamine-chondroitin 500-400 MG tablet, Take 1 tablet by mouth 3 (three) times daily., Disp: , Rfl:  .  levonorgestrel (MIRENA) 20 MCG/24HR IUD, 1 each by Intrauterine route once., Disp: , Rfl:  .  magnesium 30 MG tablet, Take 30 mg by mouth 2 (two) times daily., Disp: , Rfl:  .  Multiple Vitamin (MULTIVITAMIN) tablet, Take 1 tablet by mouth daily., Disp: , Rfl:  .  Multiple Vitamins-Minerals (HAIR/SKIN/NAILS PO), Take by mouth., Disp: , Rfl:  .  Omega-3 Fatty Acids (FISH OIL BURP-LESS PO), Take by mouth., Disp: , Rfl:  .  triamcinolone cream (KENALOG) 0.1 %, Apply 1 application topically 2 (two) times daily., Disp: 80 g, Rfl: 0  No Known Allergies   Review of Systems Constitutional: -fever, -chills, -sweats, -unexpected  weight change, -decreased appetite, -fatigue Allergy: -sneezing, -itching, -congestion Dermatology: -changing moles, --rash, -lumps, +eczema ENT: -runny nose, -ear pain, -sore throat, -hoarseness, -sinus pain, -teeth pain, - ringing in ears, -hearing loss, -nosebleeds Cardiology: -chest pain, -palpitations, -swelling, -difficulty breathing when lying flat, -waking up short of breath Respiratory: -cough, -shortness of breath, -difficulty breathing with exercise or exertion, -wheezing, -coughing up blood Gastroenterology: -abdominal pain, -nausea, -vomiting, -diarrhea, -constipation, -blood in stool, -changes in bowel movement, -difficulty swallowing or eating Hematology: -bleeding, -bruising  Musculoskeletal: -joint aches,  -muscle aches, -joint swelling, -back pain, -neck pain, -cramping, -changes in gait Ophthalmology: denies vision changes, eye redness, itching, discharge Urology: -burning with urination, -difficulty urinating, -blood in urine, -urinary frequency, -urgency, -incontinence Neurology: -headache, -weakness, -tingling, -numbness, -memory loss, -falls, -dizziness Psychology: -depressed mood, -agitation, -sleep problems     Objective:   Physical Exam  Blood pressure 92/70, pulse 80, temperature 97.8 F (36.6 C), temperature source Oral, resp. rate 18, height  (1.499 m), weight 120 lb 3.2 oz (54.522 kg).  General appearance: alert, no distress, WD/WN, [place] female  Skin: Mild rough dry skin of her distal right fourth finger, otherwise skin unremarkable, no worrisome lesions HEENT: normocephalic, conjunctiva/corneas normal, sclerae anicteric, PERRLA, EOMi, nares patent, no discharge or erythema, pharynx normal Oral cavity: MMM, tongue normal, teeth in good repair Neck: supple, no lymphadenopathy, no thyromegaly, no masses, normal ROM Chest: non tender, normal shape and expansion Heart: RRR, normal S1, S2, no murmurs Lungs: CTA bilaterally, no wheezes, rhonchi, or rales Abdomen: +bs, soft, non tender, non distended, no masses, no hepatomegaly, no splenomegaly, no bruits Back: non tender, normal ROM, no scoliosis Musculoskeletal: upper extremities non tender, no obvious deformity, normal ROM throughout, lower extremities non tender, no obvious deformity, normal ROM throughout Extremities: no edema, no cyanosis, no clubbing Pulses: 2+ symmetric, upper and lower extremities, normal cap refill Neurological: alert, oriented x 3, CN2-12 intact, strength normal upper extremities and lower extremities, sensation normal throughout, DTRs 2+ throughout, no cerebellar signs, gait normal Psychiatric: normal affect, behavior normal, pleasant  Breast/gyn/rectal - deferred to gynecology   Assessment  and Plan :    Encounter Diagnoses  Name Primary?  . Encounter for health maintenance examination in adult Yes  . Vaccine counseling   . Eczema of hand, unspecified laterality     Physical exam - discussed healthy lifestyle, diet, exercise, preventative care, vaccinations, and addressed their concerns.  Handout given. See your dentist yearly for routine dental care including hygiene visits twice yearly. Eczema - discussed preventative measures, daily moisturizing lotion, can continue hydrocortisone cream over-the-counter or triamcinolone cream for flareups Return in a week or 2 for flu and Td vaccine.  She declines today, wants to just get lab done today, no other shots today. Follow-up pending labs.

## 2015-05-31 ENCOUNTER — Ambulatory Visit (INDEPENDENT_AMBULATORY_CARE_PROVIDER_SITE_OTHER): Payer: BLUE CROSS/BLUE SHIELD | Admitting: Obstetrics & Gynecology

## 2015-05-31 ENCOUNTER — Encounter: Payer: Self-pay | Admitting: Obstetrics & Gynecology

## 2015-05-31 ENCOUNTER — Encounter: Payer: Self-pay | Admitting: *Deleted

## 2015-05-31 VITALS — BP 114/70 | HR 81 | Ht 59.0 in | Wt 119.0 lb

## 2015-05-31 DIAGNOSIS — Z30432 Encounter for removal of intrauterine contraceptive device: Secondary | ICD-10-CM

## 2015-05-31 NOTE — Progress Notes (Signed)
    GYNECOLOGY CLINIC PROCEDURE NOTE  Haley Herrera is a 29 y.o. G2P0020 here for Mirena IUD removal. Mirena was placed over 4 years ago, she wants to remove it and does not mind getting pregnant. No GYN concerns.  Last pap smear was on 10/27/14 and was normal.  IUD Removal  Patient identified, informed consent performed, consent signed.  Patient was in the dorsal lithotomy position, normal external genitalia was noted.  A speculum was placed in the patient's vagina, normal discharge was noted, no lesions. The cervix was visualized, no lesions, no abnormal discharge.  The strings of the IUD were grasped and pulled using ring forceps. The IUD was removed in its entirety.  Patient tolerated the procedure well.    Patient will use condoms for contraception but she was told to avoid teratogens, take multivitamins and folic acid in case pregnancy occurs given efficacy of barrier methods.  Routine preventative health maintenance measures emphasized.   Jaynie Collins, MD, FACOG Attending Obstetrician & Gynecologist,  [facility]

## 2015-05-31 NOTE — Patient Instructions (Signed)
Contraception Choices Contraception (birth control) is the use of any methods or devices to prevent pregnancy. Below are some methods to help avoid pregnancy. HORMONAL METHODS   Contraceptive implant. This is a thin, plastic tube containing progesterone hormone. It does not contain estrogen hormone. Your health care provider inserts the tube in the inner part of the upper arm. The tube can remain in place for up to 3 years. After 3 years, the implant must be removed. The implant prevents the ovaries from releasing an egg (ovulation), thickens the cervical mucus to prevent sperm from entering the uterus, and thins the lining of the inside of the uterus.  Progesterone-only injections. These injections are given every 3 months by your health care provider to prevent pregnancy. This synthetic progesterone hormone stops the ovaries from releasing eggs. It also thickens cervical mucus and changes the uterine lining. This makes it harder for sperm to survive in the uterus.  Birth control pills. These pills contain estrogen and progesterone hormone. They work by preventing the ovaries from releasing eggs (ovulation). They also cause the cervical mucus to thicken, preventing the sperm from entering the uterus. Birth control pills are prescribed by a health care provider.Birth control pills can also be used to treat heavy periods.  Minipill. This type of birth control pill contains only the progesterone hormone. They are taken every day of each month and must be prescribed by your health care provider.  Birth control patch. The patch contains hormones similar to those in birth control pills. It must be changed once a week and is prescribed by a health care provider.  Vaginal ring. The ring contains hormones similar to those in birth control pills. It is left in the vagina for 3 weeks, removed for 1 week, and then a new one is put back in place. The patient must be comfortable inserting and removing the ring  from the vagina.A health care provider's prescription is necessary.  Emergency contraception. Emergency contraceptives prevent pregnancy after unprotected sexual intercourse. This pill can be taken right after sex or up to 5 days after unprotected sex. It is most effective the sooner you take the pills after having sexual intercourse. Most emergency contraceptive pills are available without a prescription. Check with your pharmacist. Do not use emergency contraception as your only form of birth control. BARRIER METHODS   Female condom. This is a thin sheath (latex or rubber) that is worn over the penis during sexual intercourse. It can be used with spermicide to increase effectiveness.  Female condom. This is a soft, loose-fitting sheath that is put into the vagina before sexual intercourse.  Diaphragm. This is a soft, latex, dome-shaped barrier that must be fitted by a health care provider. It is inserted into the vagina, along with a spermicidal jelly. It is inserted before intercourse. The diaphragm should be left in the vagina for 6 to 8 hours after intercourse.  Cervical cap. This is a round, soft, latex or plastic cup that fits over the cervix and must be fitted by a health care provider. The cap can be left in place for up to 48 hours after intercourse.  Sponge. This is a soft, circular piece of polyurethane foam. The sponge has spermicide in it. It is inserted into the vagina after wetting it and before sexual intercourse.  Spermicides. These are chemicals that kill or block sperm from entering the cervix and uterus. They come in the form of creams, jellies, suppositories, foam, or tablets. They do not require a   prescription. They are inserted into the vagina with an applicator before having sexual intercourse. The process must be repeated every time you have sexual intercourse. INTRAUTERINE CONTRACEPTION  Intrauterine device (IUD). This is a T-shaped device that is put in a woman's uterus  during a menstrual period to prevent pregnancy. There are 2 types:  Copper IUD. This type of IUD is wrapped in copper wire and is placed inside the uterus. Copper makes the uterus and fallopian tubes produce a fluid that kills sperm. It can stay in place for 10 years.  Hormone IUD. This type of IUD contains the hormone progestin (synthetic progesterone). The hormone thickens the cervical mucus and prevents sperm from entering the uterus, and it also thins the uterine lining to prevent implantation of a fertilized egg. The hormone can weaken or kill the sperm that get into the uterus. It can stay in place for 3-5 years, depending on which type of IUD is used. PERMANENT METHODS OF CONTRACEPTION  Female tubal ligation. This is when the woman's fallopian tubes are surgically sealed, tied, or blocked to prevent the egg from traveling to the uterus.  Hysteroscopic sterilization. This involves placing a small coil or insert into each fallopian tube. Your doctor uses a technique called hysteroscopy to do the procedure. The device causes scar tissue to form. This results in permanent blockage of the fallopian tubes, so the sperm cannot fertilize the egg. It takes about 3 months after the procedure for the tubes to become blocked. You must use another form of birth control for these 3 months.  Female sterilization. This is when the female has the tubes that carry sperm tied off (vasectomy).This blocks sperm from entering the vagina during sexual intercourse. After the procedure, the man can still ejaculate fluid (semen). NATURAL PLANNING METHODS  Natural family planning. This is not having sexual intercourse or using a barrier method (condom, diaphragm, cervical cap) on days the woman could become pregnant.  Calendar method. This is keeping track of the length of each menstrual cycle and identifying when you are fertile.  Ovulation method. This is avoiding sexual intercourse during ovulation.  Symptothermal  method. This is avoiding sexual intercourse during ovulation, using a thermometer and ovulation symptoms.  Post-ovulation method. This is timing sexual intercourse after you have ovulated. Regardless of which type or method of contraception you choose, it is important that you use condoms to protect against the transmission of sexually transmitted infections (STIs). Talk with your health care provider about which form of contraception is most appropriate for you. Document Released: 08/14/2005 Document Revised: 08/19/2013 Document Reviewed: 02/06/2013 ExitCare Patient Information 2015 [employer]. This information is not intended to replace advice given to you by your health care provider. Make sure you discuss any questions you have with your health care provider.  

## 2015-08-09 ENCOUNTER — Ambulatory Visit (INDEPENDENT_AMBULATORY_CARE_PROVIDER_SITE_OTHER): Payer: BLUE CROSS/BLUE SHIELD | Admitting: Medical

## 2015-08-09 ENCOUNTER — Telehealth: Payer: Self-pay | Admitting: Medical

## 2015-08-09 ENCOUNTER — Encounter: Payer: Self-pay | Admitting: Medical

## 2015-08-09 VITALS — BP 112/72 | HR 60 | Wt 121.0 lb

## 2015-08-09 DIAGNOSIS — L309 Dermatitis, unspecified: Secondary | ICD-10-CM | POA: Diagnosis not present

## 2015-08-09 DIAGNOSIS — R21 Rash and other nonspecific skin eruption: Secondary | ICD-10-CM

## 2015-08-09 MED ORDER — MUPIROCIN 2 % EX OINT: 1.0000 "application " | TOPICAL_OINTMENT | Freq: Three times a day (TID) | CUTANEOUS | Status: DC

## 2015-08-09 NOTE — Progress Notes (Signed)
Subjective:   Chief Complaint  Patient presents with  . finger not getting better    finger not getting better. cream is not working.. finger hurts and cracks   Here today for ongoing rash of right 4th finger.  Last visit we had her use Triamcinolone instead of hydrocortisone but this didn't clear the rash up.  Sometimes neosporin helps.   This is ongoing.   Denies any particular trigger or exposure.  Has used lotion prior as well.      Objective:   Physical Exam  Blood pressure 112/72, pulse 60, weight 121 lb (54.885 kg).  General appearance: alert, no distress, WD/WN, [place] female  Skin: Mild rough dry skin of her distal right fourth finger, otherwise skin unremarkable, no worrisome lesions   Assessment and Plan :    Encounter Diagnoses  Name Primary?  . Rash and nonspecific skin eruption Yes  . Eczema of right hand    Eczema - failed hydrocortisone and triamcinolone.   Begin trial of Mupirocin and referral to dermatology

## 2015-08-09 NOTE — Telephone Encounter (Signed)
Pt scheduled for Wednesday 08/25/15 @9 :00am with an 8:15 arrival time with Baruch Goutyana Anderson

## 2015-08-09 NOTE — Telephone Encounter (Signed)
Refer to Dermatology for finger rash chronic.   [facility]

## 2015-09-24 IMAGING — CR DG CHEST 2V
2 series · 2 of 2 positions shown · non-contrast
Comparison: None.

CLINICAL DATA: Positive PPD

EXAM:
CHEST  2 VIEW

[w chest pa]
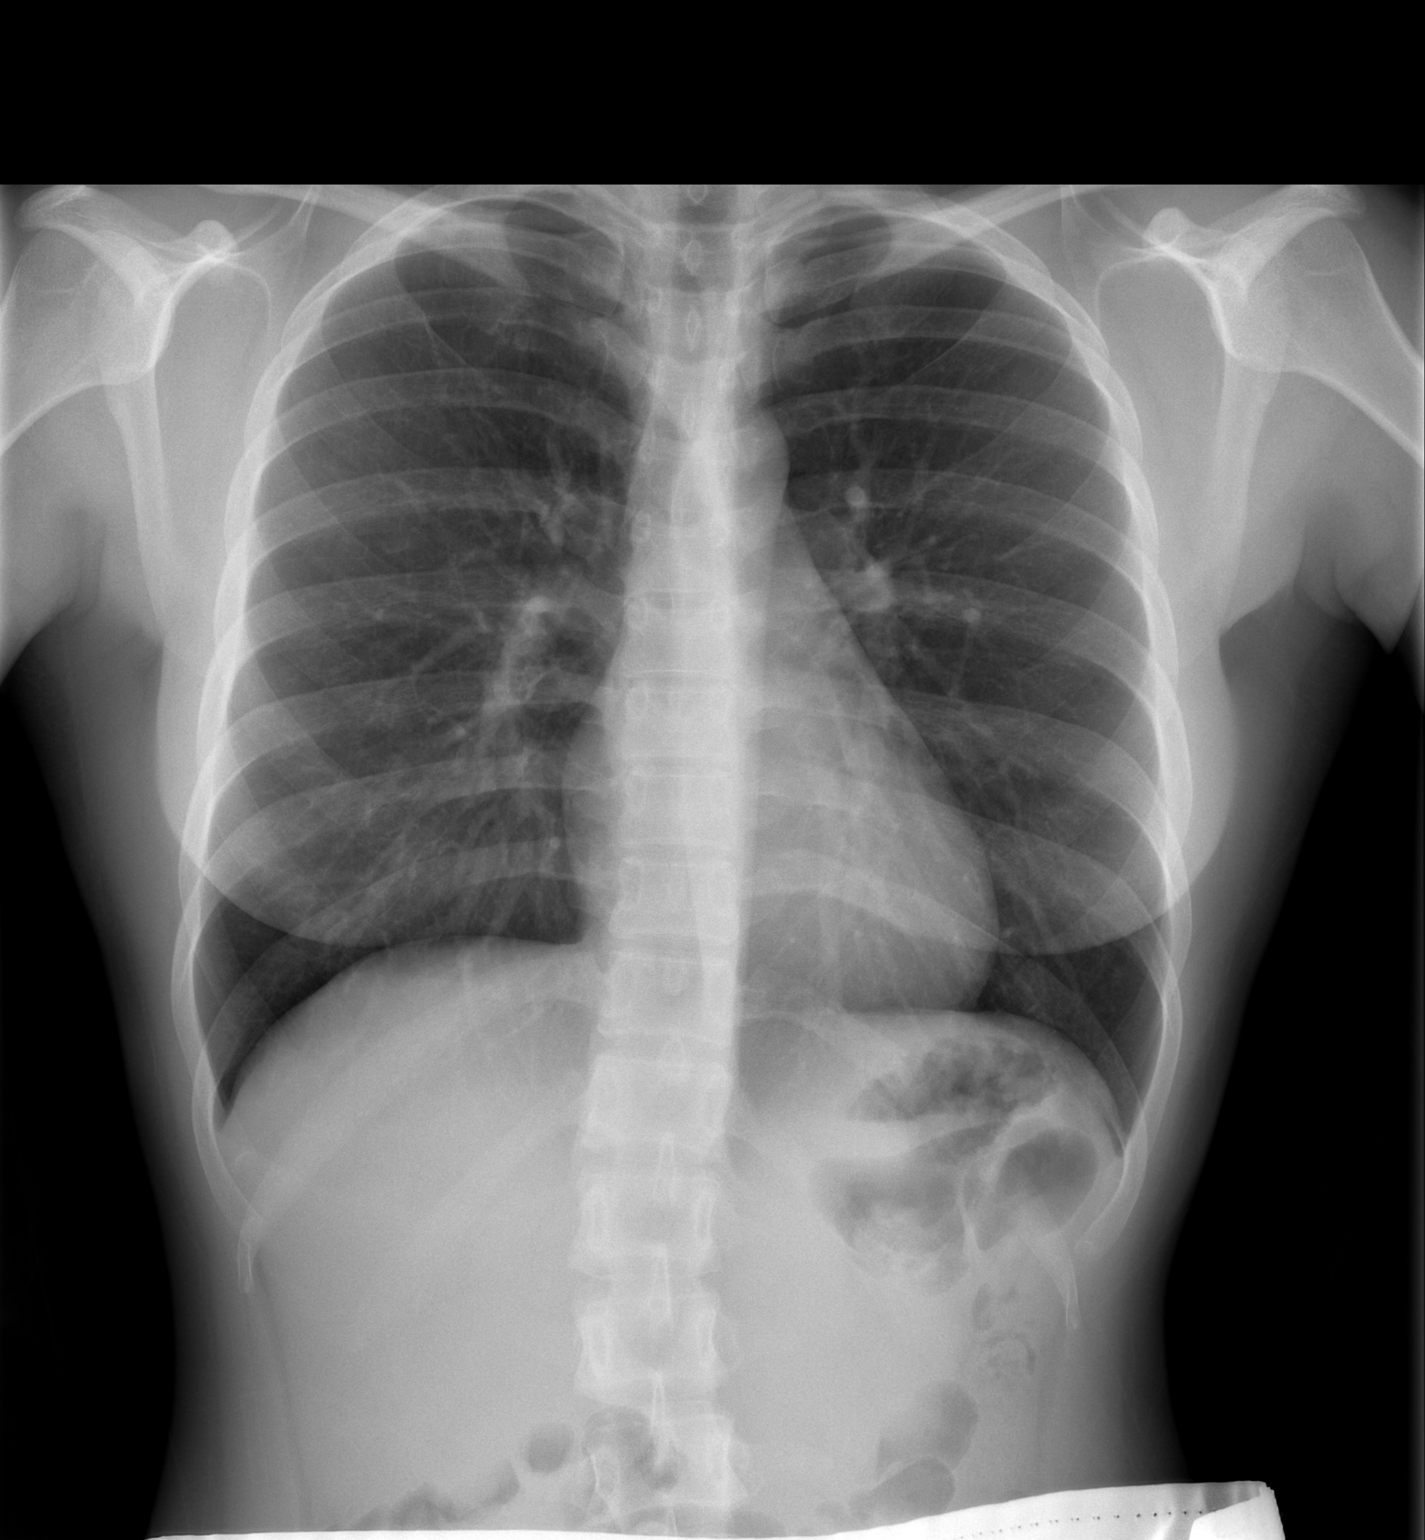

[w chest lat]
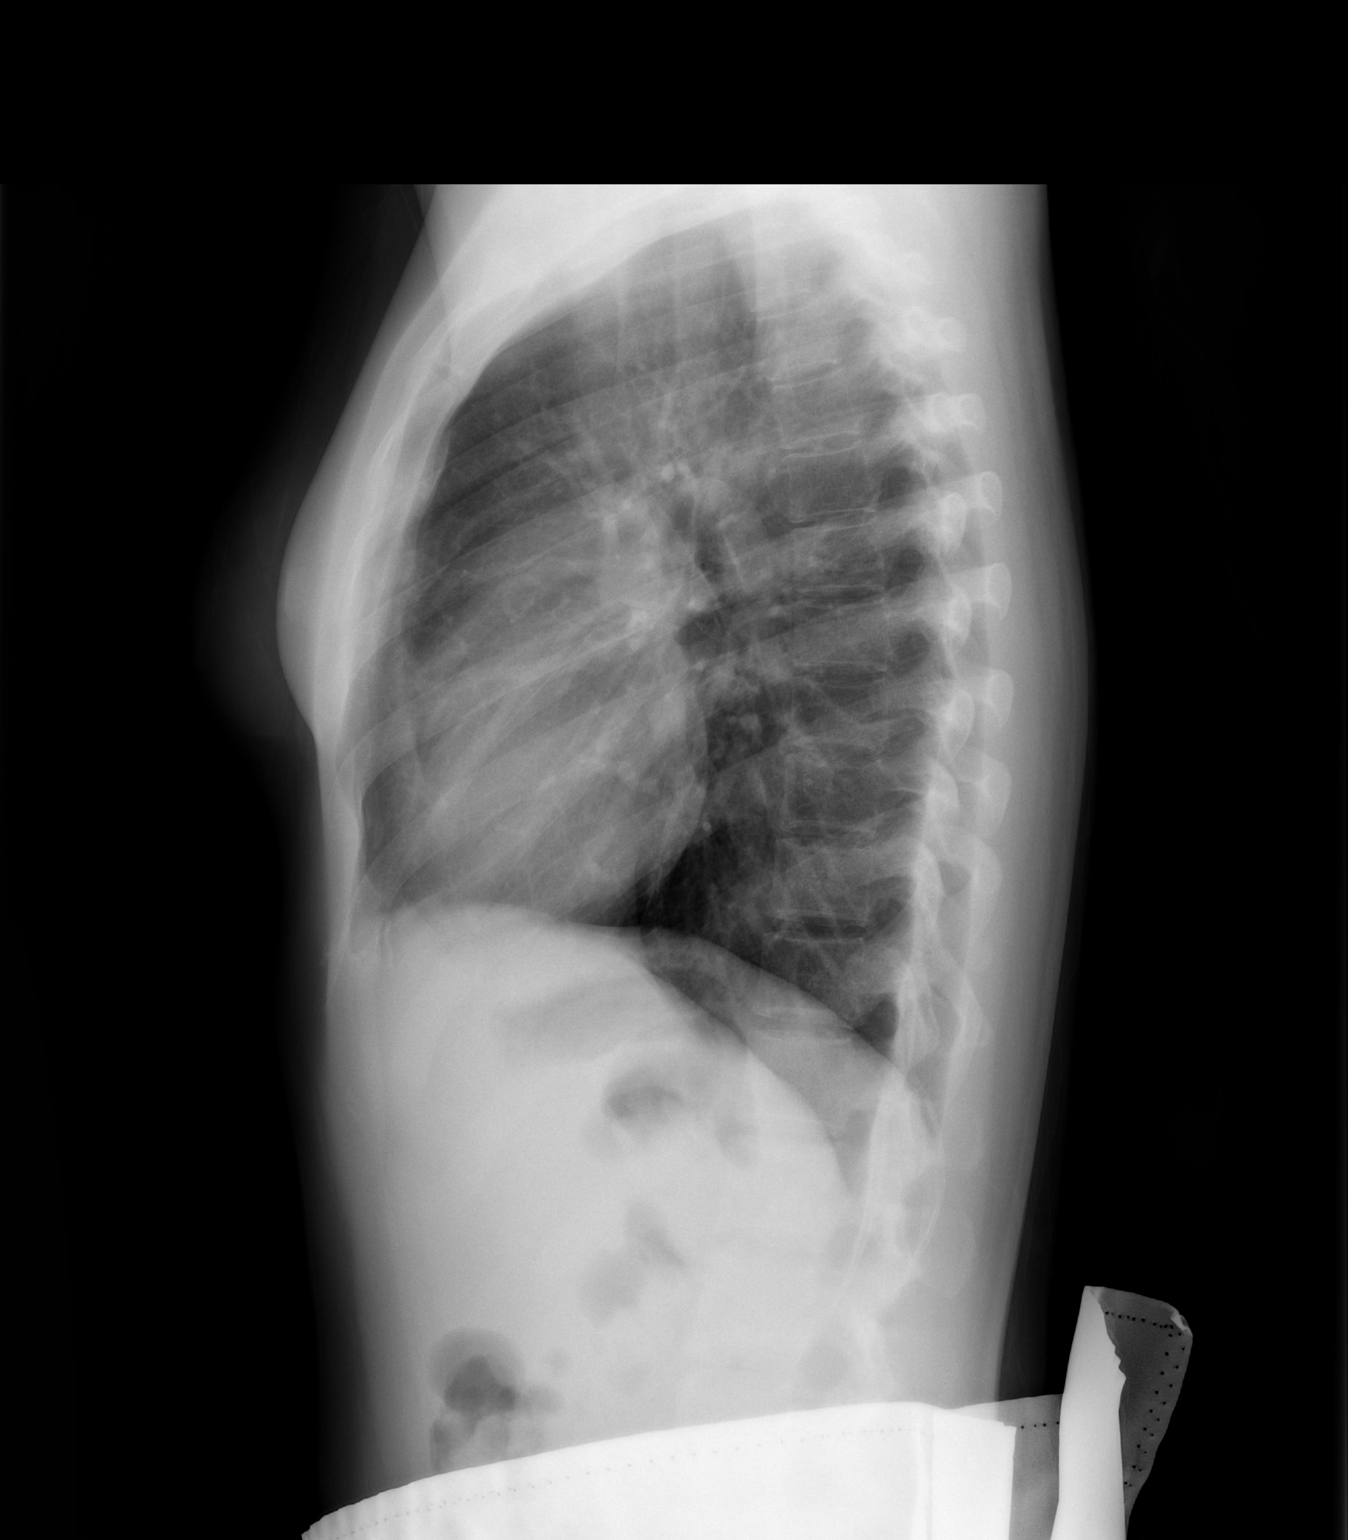

[2 of 2 positions shown; findings below may reference images not displayed]

FINDINGS: No active infiltrate or effusion is seen. No sequela of prior
tuberculous infection is noted. Mediastinal and hilar contours
appear normal. The heart is within normal limits in size. No bony
abnormality is seen.
IMPRESSION: No active cardiopulmonary disease.

## 2015-12-02 ENCOUNTER — Ambulatory Visit: Payer: BLUE CROSS/BLUE SHIELD | Admitting: Obstetrics & Gynecology

## 2015-12-02 DIAGNOSIS — Z01419 Encounter for gynecological examination (general) (routine) without abnormal findings: Secondary | ICD-10-CM

## 2015-12-30 ENCOUNTER — Ambulatory Visit (INDEPENDENT_AMBULATORY_CARE_PROVIDER_SITE_OTHER): Payer: BLUE CROSS/BLUE SHIELD | Admitting: Obstetrics and Gynecology

## 2015-12-30 ENCOUNTER — Encounter: Payer: Self-pay | Admitting: Obstetrics and Gynecology

## 2015-12-30 VITALS — BP 111/75 | HR 73 | Resp 18 | Ht 59.0 in | Wt 121.0 lb

## 2015-12-30 DIAGNOSIS — Z124 Encounter for screening for malignant neoplasm of cervix: Secondary | ICD-10-CM

## 2015-12-30 DIAGNOSIS — Z01419 Encounter for gynecological examination (general) (routine) without abnormal findings: Secondary | ICD-10-CM

## 2015-12-30 DIAGNOSIS — Z1151 Encounter for screening for human papillomavirus (HPV): Secondary | ICD-10-CM | POA: Diagnosis not present

## 2015-12-30 NOTE — Progress Notes (Signed)
  Subjective:     Haley Herrera is a 30 y.o. female G2P0020 with BMI 24 who is here for a comprehensive physical exam. The patient reports no problems. She is sexually active without contraception. Her IUD was removed last year. She is not actively trying but would welcome a pregnancy. She has regular monthly cycles of 5 days. She denies any abnormal discharge or pelvic pain.   Past Medical History  Diagnosis Date  . History of UTI   . IUD (intrauterine device) in place 06/2011    Mirena, Dr. Marice Potterove  . History of gynecological procedure     TAB x 1, SAB x 2  . History of upper GI x-ray series 2008  . Gastric ulcer 2008  . Allergy     seasonal  . Eczema of hand    Past Surgical History  Procedure Laterality Date  . Wisdom tooth extraction    . Therapeutic abortion     Family History  Problem Relation Age of Onset  . Hypertension Father   . Diabetes Maternal Grandmother   . Heart disease Maternal Grandfather   . Cancer Neg Hx   . Stroke Neg Hx     Social History   Social History  . Marital Status: Single    Spouse Name: N/A  . Number of Children: N/A  . Years of Education: N/A   Occupational History  . nanny    Social History Main Topics  . Smoking status: Never Smoker   . Smokeless tobacco: Never Used  . Alcohol Use: 6.0 oz/week    3 Standard drinks or equivalent, 7 Glasses of wine per week  . Drug Use: No  . Sexual Activity:    Partners: Male    Birth Control/ Protection: Condom, IUD   Other Topics Concern  . Not on file   Social History Narrative   Married 06/2013, no children, exercise - run, walk, weights, [employer],   [employer], taking pre-requisites to get into nursing program.   Likes to hike and camp.     Health Maintenance  Topic Date Due  . TETANUS/TDAP  10/11/2015  . INFLUENZA VACCINE  03/28/2016  . PAP SMEAR  10/26/2017  . HIV Screening  Completed     Review of Systems Pertinent items are noted in HPI.   Objective:     Blood pressure  111/75, pulse 73, resp. rate 18, height 4\' 11"  (1.499 m), weight 121 lb (54.885 kg), last menstrual period 12/13/2015. GENERAL: Well-developed, well-nourished female in no acute distress.  HEENT: Normocephalic, atraumatic. Sclerae anicteric.  NECK: Supple. Normal thyroid.  LUNGS: Clear to auscultation bilaterally.  HEART: Regular rate and rhythm. BREASTS: Symmetric in size. No palpable masses or lymphadenopathy, skin changes, or nipple drainage. ABDOMEN: Soft, nontender, nondistended. No organomegaly. PELVIC: Normal external female genitalia. Vagina is pink and rugated.  Normal discharge. Normal appearing cervix. Uterus is normal in size. No adnexal mass or tenderness. EXTREMITIES: No cyanosis, clubbing, or edema, 2+ distal pulses.    Assessment:    Healthy female exam.      Plan:    Pap smear collected Patient will be contacted with any abnormal results Patient was advised to start prenatal vitamins RTC prn See After Visit Summary for Counseling Recommendations

## 2016-01-03 LAB — CYTOLOGY - PAP

## 2016-01-14 ENCOUNTER — Encounter: Payer: Self-pay | Admitting: Family Medicine

## 2016-01-14 ENCOUNTER — Ambulatory Visit (INDEPENDENT_AMBULATORY_CARE_PROVIDER_SITE_OTHER): Payer: BLUE CROSS/BLUE SHIELD | Admitting: Family Medicine

## 2016-01-14 VITALS — BP 110/64 | HR 85 | Temp 98.1°F | Wt 121.0 lb

## 2016-01-14 DIAGNOSIS — J069 Acute upper respiratory infection, unspecified: Secondary | ICD-10-CM | POA: Diagnosis not present

## 2016-01-14 NOTE — Progress Notes (Signed)
   Subjective:    Patient ID: Haley Herrera, female    DOB: [birth year], 30 y.o.   MRN: [id number]  HPI  she complains of a one-week history of strenuous sore throat followed by cough, nasal congestion, ear congestion and PND but no fever or chills. She has been using NyQuil. She states she is now starting to feel better.   Review of Systems     Objective:   Physical Exam Alert and in no distress. Tympanic membranes and canals are normal. Pharyngeal area is normal. Neck is supple without adenopathy or thyromegaly. Cardiac exam shows a regular sinus rhythm without murmurs or gallops. Lungs are clear to auscultation.       Assessment & Plan:  Viral URI  plan supportive care including continuing on NyQuil. Explained she seems to be getting better on her own but if she turns a corner and get worse, she should call she was comfortable with this.

## 2016-01-19 ENCOUNTER — Telehealth: Payer: Self-pay | Admitting: Family Medicine

## 2016-01-19 MED ORDER — AMOXICILLIN 875 MG PO TABS: 875.0000 mg | ORAL_TABLET | Freq: Two times a day (BID) | ORAL | Status: AC

## 2016-01-19 NOTE — Telephone Encounter (Signed)
I called amoxicillin in

## 2016-01-19 NOTE — Telephone Encounter (Signed)
Pt called and stated that she is not any better. She states that now she has a sore throat. She is requesting something be called in for her. Pt uses [facility] on [address] and can be reached at [phone].

## 2016-01-20 NOTE — Telephone Encounter (Signed)
Pt informed and verbalized understanding
